# Patient Record
Sex: Female | Born: 1985 | Race: White | Hispanic: No | Smoking: Former smoker
Health system: Southern US, Community
[De-identification: ages and names within clinical notes are randomized; demographics above are authoritative.]

## PROBLEM LIST (undated history)

## (undated) DIAGNOSIS — Z87442 Personal history of urinary calculi: Secondary | ICD-10-CM

## (undated) DIAGNOSIS — Z973 Presence of spectacles and contact lenses: Secondary | ICD-10-CM

## (undated) DIAGNOSIS — F419 Anxiety disorder, unspecified: Secondary | ICD-10-CM

## (undated) DIAGNOSIS — F410 Panic disorder [episodic paroxysmal anxiety] without agoraphobia: Secondary | ICD-10-CM

## (undated) DIAGNOSIS — K603 Anal fistula, unspecified: Secondary | ICD-10-CM

## (undated) DIAGNOSIS — Z8659 Personal history of other mental and behavioral disorders: Secondary | ICD-10-CM

## (undated) DIAGNOSIS — L729 Follicular cyst of the skin and subcutaneous tissue, unspecified: Secondary | ICD-10-CM

## (undated) HISTORY — PX: TONSILLECTOMY: SUR1361

---

## 2017-08-10 ENCOUNTER — Ambulatory Visit: Payer: Self-pay | Admitting: Surgery

## 2017-08-10 NOTE — H&P (View-Only) (Signed)
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 This has been present for about one year. Initially she was able to get it to express itself and it didn't heal, however the last year it has repeatedly become inflamed and started to drain purulent material. She states that if she wears dresses set of pants for prolonged times, it will heal but she has to wear pants for her work uniform and invariably this gets irritated. It does not cause any pain.     Past Surgical History  Tonsillectomy   Diagnostic Studies History  Colonoscopy  never Mammogram  never Pap Smear  1-5 years ago  Allergies  Phenergan *ANTIHISTAMINES*   Medication History  No Current Medications Medications Reconciled  Social History  Alcohol use  Occasional alcohol use. Caffeine use  Tea. No drug use  Tobacco use  Former smoker.  Family History  Alcohol Abuse  Father, Mother. Arthritis  Father. Bleeding disorder  Mother. Depression  Mother. Ischemic Bowel Disease  Mother.  Pregnancy / Birth History  Age at menarche  54 years. Gravida  0 Para  0 Regular periods   Other Problems  Kidney Stone     Review of Systems  General Present- Weight Gain. Not Present- Appetite Loss, Chills, Fatigue, Fever, Night Sweats and Weight Loss. Skin Present- Non-Healing Wounds and Ulcer. Not Present- Change in Wart/Mole, Dryness, Hives, Jaundice, New Lesions and Rash. HEENT Present- Wears glasses/contact lenses. Not Present- Earache, Hearing Loss, Hoarseness, Nose Bleed, Oral Ulcers, Ringing in the Ears, Seasonal Allergies, Sinus Pain, Sore Throat, Visual Disturbances and Yellow Eyes. Respiratory Not Present- Bloody sputum, Chronic Cough, Difficulty Breathing, Snoring and Wheezing. Breast Not Present- Breast  Mass, Breast Pain, Nipple Discharge and Skin Changes. Cardiovascular Present- Leg Cramps. Not Present- Chest Pain, Difficulty Breathing Lying Down, Palpitations, Rapid Heart Rate, Shortness of Breath and Swelling of Extremities. Gastrointestinal Not Present- Abdominal Pain, Bloating, Bloody Stool, Change in Bowel Habits, Chronic diarrhea, Constipation, Difficulty Swallowing, Excessive gas, Gets full quickly at meals, Hemorrhoids, Indigestion, Nausea, Rectal Pain and Vomiting. Female Genitourinary Not Present- Frequency, Nocturia, Painful Urination, Pelvic Pain and Urgency. Musculoskeletal Not Present- Back Pain, Joint Pain, Joint Stiffness, Muscle Pain, Muscle Weakness and Swelling of Extremities. Neurological Present- Numbness. Not Present- Decreased Memory, Fainting, Headaches, Seizures, Tingling, Tremor, Trouble walking and Weakness. Psychiatric Not Present- Anxiety, Bipolar, Change in Sleep Pattern, Depression, Fearful and Frequent crying. Endocrine Not Present- Cold Intolerance, Excessive Hunger, Hair Changes, Heat Intolerance, Hot flashes and New Diabetes. Hematology Not Present- Blood Thinners, Easy Bruising, Excessive bleeding, Gland problems, HIV and Persistent Infections.  Vitals  07/26/2017 9:05 AM Weight: 189.38 lb Height: 60in Body Surface Area: 1.82 m Body Mass Index: 36.98 kg/m  Temp.: 98.2F(Oral)  Pulse: 94 (Regular)  BP: 118/74 (Sitting, Left Arm, Standard)       Physical Exam (Bekka Qian A. Kae Heller MD; 07/26/2017 9:12 AM) The physical exam findings are as follows: Note:Gen: alert and well appearing Eye: extraocular motion intact, no scleral icterus ENT: moist mucus membranes, dentition intact Neck: no mass or thyromegaly Chest: unlabored respirations, symmetrical air entry, clear bilaterally CV: regular rate and rhythm, no pedal edema Abdomen: soft, nontender, nondistended. No mass or organomegaly MSK: strength symmetrical throughout, no deformity Neuro:  grossly intact, normal gait Psych: normal mood and affect, appropriate insight Skin: warm and dry. On the right superior thigh/inferior buttock medially  there is a chronically inflamed subcutaneous cyst which does express white fluid with some pressure. No tenderness or surrounding erythema to suggest infection.    Assessment & Plan SUBCUTANEOUS CYST (L72.9) Story: She desires excision. I discussed risk of bleeding, infection, pain, scarring, wound problems and prolonged healing, recurrence of cyst. Questions were welcomed and answered. We'll proceed to schedule under MAC

## 2017-08-10 NOTE — H&P (Signed)
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 This has been present for about one year. Initially she was able to get it to express itself and it didn't heal, however the last year it has repeatedly become inflamed and started to drain purulent material. She states that if she wears dresses set of pants for prolonged times, it will heal but she has to wear pants for her work uniform and invariably this gets irritated. It does not cause any pain.     Past Surgical History  Tonsillectomy   Diagnostic Studies History  Colonoscopy  never Mammogram  never Pap Smear  1-5 years ago  Allergies  Phenergan *ANTIHISTAMINES*   Medication History  No Current Medications Medications Reconciled  Social History  Alcohol use  Occasional alcohol use. Caffeine use  Tea. No drug use  Tobacco use  Former smoker.  Family History  Alcohol Abuse  Father, Mother. Arthritis  Father. Bleeding disorder  Mother. Depression  Mother. Ischemic Bowel Disease  Mother.  Pregnancy / Birth History  Age at menarche  67 years. Gravida  0 Para  0 Regular periods   Other Problems  Kidney Stone     Review of Systems  General Present- Weight Gain. Not Present- Appetite Loss, Chills, Fatigue, Fever, Night Sweats and Weight Loss. Skin Present- Non-Healing Wounds and Ulcer. Not Present- Change in Wart/Mole, Dryness, Hives, Jaundice, New Lesions and Rash. HEENT Present- Wears glasses/contact lenses. Not Present- Earache, Hearing Loss, Hoarseness, Nose Bleed, Oral Ulcers, Ringing in the Ears, Seasonal Allergies, Sinus Pain, Sore Throat, Visual Disturbances and Yellow Eyes. Respiratory Not Present- Bloody sputum, Chronic Cough, Difficulty Breathing, Snoring and Wheezing. Breast Not Present- Breast  Mass, Breast Pain, Nipple Discharge and Skin Changes. Cardiovascular Present- Leg Cramps. Not Present- Chest Pain, Difficulty Breathing Lying Down, Palpitations, Rapid Heart Rate, Shortness of Breath and Swelling of Extremities. Gastrointestinal Not Present- Abdominal Pain, Bloating, Bloody Stool, Change in Bowel Habits, Chronic diarrhea, Constipation, Difficulty Swallowing, Excessive gas, Gets full quickly at meals, Hemorrhoids, Indigestion, Nausea, Rectal Pain and Vomiting. Female Genitourinary Not Present- Frequency, Nocturia, Painful Urination, Pelvic Pain and Urgency. Musculoskeletal Not Present- Back Pain, Joint Pain, Joint Stiffness, Muscle Pain, Muscle Weakness and Swelling of Extremities. Neurological Present- Numbness. Not Present- Decreased Memory, Fainting, Headaches, Seizures, Tingling, Tremor, Trouble walking and Weakness. Psychiatric Not Present- Anxiety, Bipolar, Change in Sleep Pattern, Depression, Fearful and Frequent crying. Endocrine Not Present- Cold Intolerance, Excessive Hunger, Hair Changes, Heat Intolerance, Hot flashes and New Diabetes. Hematology Not Present- Blood Thinners, Easy Bruising, Excessive bleeding, Gland problems, HIV and Persistent Infections.  Vitals  07/26/2017 9:05 AM Weight: 189.38 lb Height: 60in Body Surface Area: 1.82 m Body Mass Index: 36.98 kg/m  Temp.: 98.33F(Oral)  Pulse: 94 (Regular)  BP: 118/74 (Sitting, Left Arm, Standard)       Physical Exam (Shirley Lewis A. Kae Heller MD; 07/26/2017 9:12 AM) The physical exam findings are as follows: Note:Gen: alert and well appearing Eye: extraocular motion intact, no scleral icterus ENT: moist mucus membranes, dentition intact Neck: no mass or thyromegaly Chest: unlabored respirations, symmetrical air entry, clear bilaterally CV: regular rate and rhythm, no pedal edema Abdomen: soft, nontender, nondistended. No mass or organomegaly MSK: strength symmetrical throughout, no deformity Neuro:  grossly intact, normal gait Psych: normal mood and affect, appropriate insight Skin: warm and dry. On the right superior thigh/inferior buttock medially  there is a chronically inflamed subcutaneous cyst which does express white fluid with some pressure. No tenderness or surrounding erythema to suggest infection.    Assessment & Plan SUBCUTANEOUS CYST (L72.9) Story: She desires excision. I discussed risk of bleeding, infection, pain, scarring, wound problems and prolonged healing, recurrence of cyst. Questions were welcomed and answered. We'll proceed to schedule under MAC

## 2017-08-17 NOTE — Pre-Procedure Instructions (Signed)
Shirley Lewis  08/17/2017      [facility] [place] [address] [place] [address] Phone: [phone] Fax: [phone]    Your procedure is scheduled on Fri., August 24, 2017 from 7:30AM-8:30AM  [facility]" at 5:30AM  Call this number if you have problems the morning of surgery:  [phone]   Remember:  Do not eat or drink after midnight on July 4th    Take these medicines the morning of surgery with A SIP OF WATER: NONE  As of today, stop taking all Other Aspirin Products, Vitamins, Fish oils, and Herbal medications. Also stop all NSAIDS i.e. Advil, Ibuprofen, Motrin, Aleve, Anaprox, Naproxen, BC, Goody Powders, and all Supplements.    Do not wear jewelry, make-up or nail polish.  Do not wear lotions, powders, or perfumes, or deodorant.  Do not shave 48 hours prior to surgery.    Do not bring valuables to the hospital.  [facility] is not responsible for any belongings or valuables.  Contacts, dentures or bridgework may not be worn into surgery.  Leave your suitcase in the car.  After surgery it may be brought to your room.  For patients admitted to the hospital, discharge time will be determined by your treatment team.  Patients discharged the day of surgery will not be allowed to drive home.   Special instructions:  [facility]- Preparing For Surgery  Before surgery, you can play an important role. Because skin is not sterile, your skin needs to be as free of germs as possible. You can reduce the number of germs on your skin by washing with CHG (chlorahexidine gluconate) Soap before surgery.  CHG is an antiseptic cleaner which kills germs and bonds with the skin to continue killing germs even after washing.    Oral Hygiene is also important to reduce your risk of infection.  Remember - BRUSH YOUR TEETH THE MORNING OF SURGERY WITH YOUR REGULAR  TOOTHPASTE  Please do not use if you have an allergy to CHG or antibacterial soaps. If your skin becomes reddened/irritated stop using the CHG.  Do not shave (including legs and underarms) for at least 48 hours prior to first CHG shower. It is OK to shave your face.  Please follow these instructions carefully.   1. Shower the NIGHT BEFORE SURGERY and the MORNING OF SURGERY with CHG.   2. If you chose to wash your hair, wash your hair first as usual with your normal shampoo.  3. After you shampoo, rinse your hair and body thoroughly to remove the shampoo.  4. Use CHG as you would any other liquid soap. You can apply CHG directly to the skin and wash gently with a scrungie or a clean washcloth.   5. Apply the CHG Soap to your body ONLY FROM THE NECK DOWN.  Do not use on open wounds or open sores. Avoid contact with your eyes, ears, mouth and genitals (private parts). Wash Face and genitals (private parts)  with your normal soap.  6. Wash thoroughly, paying special attention to the area where your surgery will be performed.  7. Thoroughly rinse your body with warm water from the neck down.  8. DO NOT shower/wash with your normal soap after using and rinsing off the CHG Soap.  9. Pat yourself dry with a CLEAN TOWEL.  10. Wear CLEAN PAJAMAS to bed the night before surgery, wear comfortable clothes  the morning of surgery  11. Place CLEAN SHEETS on your bed the night of your first shower and DO NOT SLEEP WITH PETS.  Day of Surgery:  Do not apply any deodorants/lotions.  Please wear clean clothes to the hospital/surgery center.   Remember to brush your teeth WITH YOUR REGULAR TOOTHPASTE.  Please read over the following fact sheets that you were given. Pain Booklet, Coughing and Deep Breathing and Surgical Site Infection Prevention

## 2017-08-20 ENCOUNTER — Encounter (HOSPITAL_COMMUNITY)
Admission: RE | Admit: 2017-08-20 | Discharge: 2017-08-20 | Disposition: A | Discharge status: Home / Self Care | Payer: Self-pay | Source: Ambulatory Visit | Attending: Surgery | Admitting: Surgery

## 2017-08-20 ENCOUNTER — Encounter (HOSPITAL_COMMUNITY): Payer: Self-pay

## 2017-08-20 ENCOUNTER — Other Ambulatory Visit: Payer: Self-pay

## 2017-08-20 DIAGNOSIS — Z01812 Encounter for preprocedural laboratory examination: Secondary | ICD-10-CM | POA: Insufficient documentation

## 2017-08-20 HISTORY — DX: Panic disorder (episodic paroxysmal anxiety): F41.0

## 2017-08-20 HISTORY — DX: Follicular cyst of the skin and subcutaneous tissue, unspecified: L72.9

## 2017-08-20 HISTORY — DX: Personal history of urinary calculi: Z87.442

## 2017-08-20 LAB — CBC
HCT: 42.9 % (ref 36.0–46.0)
HEMOGLOBIN: 13.1 g/dL (ref 12.0–15.0)
MCH: 25.6 pg — AB (ref 26.0–34.0)
MCHC: 30.5 g/dL (ref 30.0–36.0)
MCV: 84 fL (ref 78.0–100.0)
Platelets: 335 10*3/uL (ref 150–400)
RBC: 5.11 MIL/uL (ref 3.87–5.11)
RDW: 14.5 % (ref 11.5–15.5)
WBC: 8.2 10*3/uL (ref 4.0–10.5)

## 2017-08-20 LAB — BASIC METABOLIC PANEL
Anion gap: 11 (ref 5–15)
BUN: 12 mg/dL (ref 6–20)
CHLORIDE: 104 mmol/L (ref 98–111)
CO2: 22 mmol/L (ref 22–32)
Calcium: 8.8 mg/dL — ABNORMAL LOW (ref 8.9–10.3)
Creatinine, Ser: 0.89 mg/dL (ref 0.44–1.00)
GFR calc non Af Amer: >60 mL/min (ref >60)
Glucose, Bld: 90 mg/dL (ref 70–99)
Potassium: 3.9 mmol/L (ref 3.5–5.1)
Sodium: 137 mmol/L (ref 135–145)

## 2017-08-20 NOTE — Progress Notes (Signed)
PCP - Denies  Cardiologist - Denies  Chest x-ray - Denies  EKG - Denies  Stress Test - Denies  ECHO - Denies  Cardiac Cath - Denies  Sleep Study - Denies CPAP - None  LABS- 08/20/17: CBC, BMP POC UPreg: 08/24/17  ASA- Denies   Anesthesia- No  Pt denies having chest pain, sob, or fever at this time. All instructions explained to the pt, with a verbal understanding of the material. Pt agrees to go over the instructions while at home for a better understanding. The opportunity to ask questions was provided.

## 2017-08-24 ENCOUNTER — Ambulatory Visit (HOSPITAL_COMMUNITY)
Admission: RE | Admit: 2017-08-24 | Discharge: 2017-08-24 | Disposition: A | Discharge status: Home / Self Care | Payer: Self-pay | Source: Ambulatory Visit | Attending: Surgery | Admitting: Surgery

## 2017-08-24 ENCOUNTER — Ambulatory Visit (HOSPITAL_COMMUNITY): Payer: Self-pay | Admitting: Anesthesiology

## 2017-08-24 ENCOUNTER — Encounter (HOSPITAL_COMMUNITY)
Admission: RE | Disposition: A | Discharge status: Home / Self Care | Payer: Self-pay | Source: Ambulatory Visit | Attending: Surgery

## 2017-08-24 DIAGNOSIS — Z87891 Personal history of nicotine dependence: Secondary | ICD-10-CM | POA: Insufficient documentation

## 2017-08-24 DIAGNOSIS — L02215 Cutaneous abscess of perineum: Secondary | ICD-10-CM | POA: Insufficient documentation

## 2017-08-24 DIAGNOSIS — Z888 Allergy status to other drugs, medicaments and biological substances status: Secondary | ICD-10-CM | POA: Insufficient documentation

## 2017-08-24 HISTORY — PX: CYST REMOVAL TRUNK: SHX6283

## 2017-08-24 LAB — POCT PREGNANCY, URINE: Preg Test, Ur: NEGATIVE

## 2017-08-24 SURGERY — CYST REMOVAL TRUNK
Anesthesia: Monitor Anesthesia Care | Site: Buttocks | Laterality: Right

## 2017-08-24 MED ORDER — FENTANYL CITRATE (PF) 250 MCG/5ML IJ SOLN
INTRAMUSCULAR | Status: DC | PRN
  Administered 2017-08-24 (×2): 50 ug via INTRAVENOUS

## 2017-08-24 MED ORDER — PROPOFOL 10 MG/ML IV BOLUS
INTRAVENOUS | Status: DC | PRN
  Administered 2017-08-24: 20 mg via INTRAVENOUS

## 2017-08-24 MED ORDER — ACETAMINOPHEN 500 MG PO TABS
1000.0000 mg | ORAL_TABLET | ORAL | Status: AC
  Administered 2017-08-24: 1000 mg via ORAL
  Filled 2017-08-24: qty 2

## 2017-08-24 MED ORDER — MIDAZOLAM HCL 5 MG/5ML IJ SOLN
INTRAMUSCULAR | Status: DC | PRN
  Administered 2017-08-24: 2 mg via INTRAVENOUS

## 2017-08-24 MED ORDER — LIDOCAINE 2% (20 MG/ML) 5 ML SYRINGE
INTRAMUSCULAR | Status: AC
  Filled 2017-08-24: qty 10

## 2017-08-24 MED ORDER — PROPOFOL 500 MG/50ML IV EMUL
INTRAVENOUS | Status: DC | PRN
  Administered 2017-08-24: 75 ug/kg/min via INTRAVENOUS

## 2017-08-24 MED ORDER — BUPIVACAINE-EPINEPHRINE 0.25% -1:200000 IJ SOLN
INTRAMUSCULAR | Status: DC | PRN
  Administered 2017-08-24: 10 mL
  Administered 2017-08-24: 4 mL

## 2017-08-24 MED ORDER — ONDANSETRON HCL 4 MG/2ML IJ SOLN
INTRAMUSCULAR | Status: AC
Start: 2017-08-24 — End: ?
  Filled 2017-08-24: qty 4

## 2017-08-24 MED ORDER — LIDOCAINE 2% (20 MG/ML) 5 ML SYRINGE
INTRAMUSCULAR | Status: DC | PRN
  Administered 2017-08-24: 50 mg via INTRAVENOUS

## 2017-08-24 MED ORDER — DEXMEDETOMIDINE HCL 200 MCG/2ML IV SOLN
INTRAVENOUS | Status: DC | PRN
  Administered 2017-08-24 (×4): 8 ug via INTRAVENOUS

## 2017-08-24 MED ORDER — CELECOXIB 200 MG PO CAPS
200.0000 mg | ORAL_CAPSULE | ORAL | Status: AC
  Administered 2017-08-24: 200 mg via ORAL
  Filled 2017-08-24: qty 1

## 2017-08-24 MED ORDER — PROPOFOL 10 MG/ML IV BOLUS
INTRAVENOUS | Status: AC
  Filled 2017-08-24: qty 20

## 2017-08-24 MED ORDER — PROPOFOL 1000 MG/100ML IV EMUL
INTRAVENOUS | Status: AC
  Filled 2017-08-24: qty 100

## 2017-08-24 MED ORDER — LACTATED RINGERS IV SOLN
INTRAVENOUS | Status: DC | PRN
  Administered 2017-08-24: 07:00:00 via INTRAVENOUS

## 2017-08-24 MED ORDER — DOCUSATE SODIUM 100 MG PO CAPS: 100.0000 mg | ORAL_CAPSULE | Freq: Two times a day (BID) | ORAL | 0 refills | Status: AC

## 2017-08-24 MED ORDER — CHLORHEXIDINE GLUCONATE 4 % EX LIQD: 60.0000 mL | Freq: Once | CUTANEOUS | Status: DC

## 2017-08-24 MED ORDER — 0.9 % SODIUM CHLORIDE (POUR BTL) OPTIME
TOPICAL | Status: DC | PRN
  Administered 2017-08-24: 1000 mL

## 2017-08-24 MED ORDER — CEFAZOLIN SODIUM-DEXTROSE 2-4 GM/100ML-% IV SOLN
2.0000 g | INTRAVENOUS | Status: DC
  Filled 2017-08-24: qty 100

## 2017-08-24 MED ORDER — TRAMADOL HCL 50 MG PO TABS: 50.0000 mg | ORAL_TABLET | Freq: Four times a day (QID) | ORAL | 0 refills | Status: AC | PRN

## 2017-08-24 MED ORDER — FENTANYL CITRATE (PF) 250 MCG/5ML IJ SOLN
INTRAMUSCULAR | Status: AC
  Filled 2017-08-24: qty 5

## 2017-08-24 MED ORDER — MIDAZOLAM HCL 2 MG/2ML IJ SOLN
INTRAMUSCULAR | Status: AC
  Filled 2017-08-24: qty 2

## 2017-08-24 MED ORDER — DIPHENHYDRAMINE HCL 50 MG/ML IJ SOLN
INTRAMUSCULAR | Status: DC | PRN
  Administered 2017-08-24: 25 mg via INTRAVENOUS

## 2017-08-24 MED ORDER — DIPHENHYDRAMINE HCL 50 MG/ML IJ SOLN
INTRAMUSCULAR | Status: AC
  Filled 2017-08-24: qty 1

## 2017-08-24 MED ORDER — ONDANSETRON HCL 4 MG/2ML IJ SOLN
INTRAMUSCULAR | Status: DC | PRN
  Administered 2017-08-24: 4 mg via INTRAVENOUS

## 2017-08-24 MED ORDER — ROCURONIUM BROMIDE 10 MG/ML (PF) SYRINGE
PREFILLED_SYRINGE | INTRAVENOUS | Status: AC
  Filled 2017-08-24: qty 10

## 2017-08-24 MED ORDER — BUPIVACAINE-EPINEPHRINE (PF) 0.25% -1:200000 IJ SOLN
INTRAMUSCULAR | Status: AC
  Filled 2017-08-24: qty 30

## 2017-08-24 MED ORDER — GABAPENTIN 300 MG PO CAPS: 300.0000 mg | ORAL_CAPSULE | ORAL | Status: DC

## 2017-08-24 MED ORDER — SUGAMMADEX SODIUM 200 MG/2ML IV SOLN
INTRAVENOUS | Status: AC
Start: 2017-08-24 — End: ?
  Filled 2017-08-24: qty 2

## 2017-08-24 MED FILL — traMADol HCL 50 MG TABS: 50 | 5 days supply | Qty: 20 | Fill #0

## 2017-08-24 SURGICAL SUPPLY — 35 items
CANISTER SUCT 3000ML PPV (MISCELLANEOUS) ×2 IMPLANT
COVER SURGICAL LIGHT HANDLE (MISCELLANEOUS) ×2 IMPLANT
DECANTER SPIKE VIAL GLASS SM (MISCELLANEOUS) ×2 IMPLANT
DERMABOND ADVANCED (GAUZE/BANDAGES/DRESSINGS) ×1
DERMABOND ADVANCED .7 DNX12 (GAUZE/BANDAGES/DRESSINGS) ×1 IMPLANT
DRAPE LAPAROTOMY 100X72 PEDS (DRAPES) ×2 IMPLANT
DRAPE UTILITY XL STRL (DRAPES) ×2 IMPLANT
ELECT CAUTERY BLADE 6.4 (BLADE) ×2 IMPLANT
ELECT REM PT RETURN 9FT ADLT (ELECTROSURGICAL) ×2
ELECTRODE REM PT RTRN 9FT ADLT (ELECTROSURGICAL) ×1 IMPLANT
GAUZE SPONGE 4X4 12PLY STRL (GAUZE/BANDAGES/DRESSINGS) ×2 IMPLANT
GAUZE SPONGE 4X4 12PLY STRL LF (GAUZE/BANDAGES/DRESSINGS) ×2 IMPLANT
GLOVE BIO SURGEON STRL SZ 6 (GLOVE) ×2 IMPLANT
GLOVE INDICATOR 6.5 STRL GRN (GLOVE) ×2 IMPLANT
GOWN STRL REUS W/ TWL LRG LVL3 (GOWN DISPOSABLE) ×1 IMPLANT
GOWN STRL REUS W/TWL LRG LVL3 (GOWN DISPOSABLE) ×1
KIT BASIN OR (CUSTOM PROCEDURE TRAY) ×2 IMPLANT
KIT TURNOVER KIT B (KITS) ×2 IMPLANT
LEGGING LITHOTOMY PAIR STRL (DRAPES) ×2 IMPLANT
NEEDLE HYPO 25GX1X1/2 BEV (NEEDLE) ×2 IMPLANT
NS IRRIG 1000ML POUR BTL (IV SOLUTION) ×2 IMPLANT
PACK SURGICAL SETUP 50X90 (CUSTOM PROCEDURE TRAY) ×2 IMPLANT
PAD ARMBOARD 7.5X6 YLW CONV (MISCELLANEOUS) ×2 IMPLANT
PENCIL BUTTON HOLSTER BLD 10FT (ELECTRODE) ×2 IMPLANT
SPECIMEN JAR MEDIUM (MISCELLANEOUS) ×2 IMPLANT
SPONGE LAP 18X18 X RAY DECT (DISPOSABLE) ×2 IMPLANT
SUT MNCRL AB 4-0 PS2 18 (SUTURE) ×2 IMPLANT
SUT VIC AB 3-0 SH 27 (SUTURE) ×1
SUT VIC AB 3-0 SH 27XBRD (SUTURE) ×1 IMPLANT
SYR BULB 3OZ (MISCELLANEOUS) ×2 IMPLANT
SYR CONTROL 10ML LL (SYRINGE) ×2 IMPLANT
TOWEL OR 17X24 6PK STRL BLUE (TOWEL DISPOSABLE) ×2 IMPLANT
TOWEL OR 17X26 10 PK STRL BLUE (TOWEL DISPOSABLE) ×2 IMPLANT
TUBE CONNECTING 12X1/4 (SUCTIONS) ×2 IMPLANT
YANKAUER SUCT BULB TIP NO VENT (SUCTIONS) IMPLANT

## 2017-08-24 NOTE — Op Note (Signed)
Operative Note  Shirley Lewis  [id number]  [id number]  08/24/2017   Surgeon: Vikki Ports A ConnorMD  Assistant: none  Procedure performed: excision of subcutaneous 3x2x2cm cyst/ chronic draining abscess right perineum  Preop diagnosis: chronic draining abscess/ subcutaneous cyst Post-op diagnosis/intraop findings: same  Specimens: right perineal subcutaneous cyst Retained items: no EBL: minimal cc Complications: none  Description of procedure: After obtaining informed consent the patient was taken to the operating room and placed in the lithotomy position on operating room table [facility] was initiated, preoperative antibiotics were administered, SCDs applied, and a formal timeout was performed. The perineum was prepped and draped in usual sterile fashion. After infiltration with local, an elliptical incision was made around the chronic subcutaneous cyst/abscess which was located several centimeters to the right of the perineal body. Dissection was carried down using cautery and the chronic granulation tissue/cyst wall was noted to track anteromedially and was somewhat deep, about 2 cm. The cyst was excised and passed off for pathology. The remaining chronic granulation tissue in the deeper tract was ablated with cautery. Hemostasis was ensured within the wound. The wound was then closed with interrupted deep dermal 30 Vicryls and running subcuticular Monocryl. A dry dressing was applied. The patient was then awakened, returned to the supine position and taken to PACU in stable condition.   All counts were correct at the completion of the case.

## 2017-08-24 NOTE — Anesthesia Preprocedure Evaluation (Signed)
Anesthesia Evaluation  Patient identified by MRN, date of birth, ID band Patient awake    Reviewed: Allergy & Precautions, NPO status , Patient's Chart, lab work & pertinent test results  Airway Mallampati: II  TM Distance: >3 FB Neck ROM: Full    Dental no notable dental hx.    Pulmonary neg pulmonary ROS,    Pulmonary exam normal breath sounds clear to auscultation       Cardiovascular negative cardio ROS Normal cardiovascular exam Rhythm:Regular Rate:Normal     Neuro/Psych negative neurological ROS  negative psych ROS   GI/Hepatic negative GI ROS, Neg liver ROS,   Endo/Other  negative endocrine ROS  Renal/GU negative Renal ROS  negative genitourinary   Musculoskeletal negative musculoskeletal ROS (+)   Abdominal   Peds negative pediatric ROS (+)  Hematology negative hematology ROS (+)   Anesthesia Other Findings   Reproductive/Obstetrics negative OB ROS                             Anesthesia Physical Anesthesia Plan  ASA: II  Anesthesia Plan: MAC   Post-op Pain Management:    Induction: Intravenous  PONV Risk Score and Plan: 2 and Ondansetron and Treatment may vary due to age or medical condition  Airway Management Planned: Simple Face Mask  Additional Equipment:   Intra-op Plan:   Post-operative Plan:   Informed Consent: I have reviewed the patients History and Physical, chart, labs and discussed the procedure including the risks, benefits and alternatives for the proposed anesthesia with the patient or authorized representative who has indicated his/her understanding and acceptance.   Dental advisory given  Plan Discussed with: CRNA  Anesthesia Plan Comments:         Anesthesia Quick Evaluation

## 2017-08-24 NOTE — Transfer of Care (Signed)
Immediate Anesthesia Transfer of Care Note  Patient: Systems analyst  Procedure(s) Performed: EXCISION SUBCUTANEOUS CYST RIGHT BUTTOCK/THIGH (Right Buttocks)  Patient Location: PACU  Anesthesia Type:MAC  Level of Consciousness: awake, alert  and oriented  Airway & Oxygen Therapy: Patient Spontanous Breathing and Patient connected to nasal cannula oxygen  Post-op Assessment: Report given to RN, Post -op Vital signs reviewed and stable and Patient moving all extremities X 4  Post vital signs: Reviewed and stable  Last Vitals:  Vitals Value Taken Time  BP 109/68 08/24/2017  8:18 AM  Temp    Pulse 68 08/24/2017  8:19 AM  Resp 20 08/24/2017  8:19 AM  SpO2 99 % 08/24/2017  8:19 AM  Vitals shown include unvalidated device data.  Last Pain:  Vitals:   08/24/17 0641  PainSc: 0-No pain         Complications: No apparent anesthesia complications

## 2017-08-24 NOTE — Anesthesia Procedure Notes (Signed)
Procedure Name: MAC Date/Time: 08/24/2017 7:31 AM Performed by: Mariea Clonts, CRNA Pre-anesthesia Checklist: Patient identified, Emergency Drugs available, Suction available, Patient being monitored and Timeout performed Patient Re-evaluated:Patient Re-evaluated prior to induction Oxygen Delivery Method: Nasal cannula

## 2017-08-24 NOTE — Discharge Instructions (Signed)
GENERAL SURGERY: POST OP INSTRUCTIONS  ######################################################################  EAT Gradually transition to a high fiber diet with a fiber supplement over the next few weeks after discharge.  Start with a pureed / full liquid diet (see below)  WALK Walk an hour a day.  Control your pain to do that.    CONTROL PAIN Control pain so that you can walk, sleep, tolerate sneezing/coughing, go up/down stairs.  HAVE A BOWEL MOVEMENT DAILY Keep your bowels regular to avoid problems.  OK to try a laxative to override constipation.  OK to use an antidairrheal to slow down diarrhea.  Call if not better after 2 tries  CALL IF YOU HAVE PROBLEMS/CONCERNS Call if you are still struggling despite following these instructions. Call if you have concerns not answered by these instructions  ######################################################################    1. DIET: Follow a light bland diet the first 24 hours after arrival home, such as soup, liquids, crackers, etc.  Be sure to include lots of fluids daily.  Avoid fast food or heavy meals as you are more likely to get nauseated.   2. Take your usually prescribed home medications unless otherwise directed. 3. PAIN CONTROL: a. Pain is best controlled by a usual combination of three different methods TOGETHER: i. Ice/Heat ii. Over the counter pain medication iii. Prescription pain medication b. Most patients will experience some swelling and bruising around the incisions.  Ice packs or heating pads (30-60 minutes up to 6 times a day) will help. Use ice for the first few days to help decrease swelling and bruising, then switch to heat to help relax tight/sore spots and speed recovery.  Some people prefer to use ice alone, heat alone, alternating between ice & heat.  Experiment to what works for you.  Swelling and bruising can take several weeks to resolve.   c. It is helpful to take an over-the-counter pain medication  regularly for the first few weeks.  Choose one of the following that works best for you: i. Naproxen (Aleve, etc)  Two 220mg  tabs twice a day ii. Ibuprofen (Advil, etc) Three 200mg  tabs four times a day (every meal & bedtime) iii. Acetaminophen (Tylenol, etc) 500-650mg  four times a day (every meal & bedtime) d. A  prescription for pain medication (such as oxycodone, hydrocodone, etc) should be given to you upon discharge.  Take your pain medication as prescribed.  i. If you are having problems/concerns with the prescription medicine (does not control pain, nausea, vomiting, rash, itching, etc), please call us [phone] to see if we need to switch you to a different pain medicine that will work better for you and/or control your side effect better. ii. If you need a refill on your pain medication, please contact your pharmacy.  They will contact our office to request authorization. Prescriptions will not be filled after 5 pm or on week-ends. 4. Avoid getting constipated.  Between the surgery and the pain medications, it is common to experience some constipation.  Increasing fluid intake and taking a fiber supplement (such as Metamucil, Citrucel, FiberCon, MiraLax, etc) 1-2 times a day regularly will usually help prevent this problem from occurring.  A mild laxative (prune juice, Milk of Magnesia, MiraLax, etc) should be taken according to package directions if there are no bowel movements after 48 hours.   5. Wash / shower every day starting on post op day 2.  Remove any dressings on post op day 2 or if they become saturated or dirty.  Continue to shower over  incision(s) after the dressing is off. No soaking or swimming until the incision is healed.  6. Remove your bandages 2 days after surgery, or earlier if they become saturated or dirty.  You may leave the incision open to air.  You may have skin tapes (Steri Strips) covering the incision(s).  Leave them on until one week, then remove.  You may  replace a dressing/Band-Aid to cover the incision for comfort if you wish.      7. ACTIVITIES as tolerated:   a. You may resume regular (light) daily activities beginning the next day--such as daily self-care, walking, climbing stairs--gradually increasing activities as tolerated.  If you can walk 30 minutes without difficulty, it is safe to try more intense activity such as jogging, treadmill, bicycling, low-impact aerobics, swimming, etc. b. Save the most intensive and strenuous activity for last such as sit-ups, heavy lifting, contact sports, etc  Refrain from any heavy lifting or straining until you are off narcotics for pain control.   c. DO NOT PUSH THROUGH PAIN.  Let pain be your guide: If it hurts to do something, don't do it.  Pain is your body warning you to avoid that activity for another week until the pain goes down. d. You may drive when you are no longer taking prescription pain medication, you can comfortably wear a seatbelt, and you can safely maneuver your car and apply brakes. e. Bonita QuinYou may have sexual intercourse when it is comfortable.  8. FOLLOW UP in our office a. Please call [facility] at [phone] to set up an appointment to see your surgeon in the office for a follow-up appointment approximately 2-3 weeks after your surgery. b. Make sure that you call for this appointment the day you arrive home to insure a convenient appointment time. 9. IF YOU HAVE DISABILITY OR FAMILY LEAVE FORMS, BRING THEM TO THE OFFICE FOR PROCESSING.  DO NOT GIVE THEM TO YOUR DOCTOR.   WHEN TO CALL US [phone]: 1. Poor pain control 2. Reactions / problems with new medications (rash/itching, nausea, etc)  3. Fever over 101.5 F (38.5 C) 4. Worsening swelling or bruising 5. Continued bleeding from incision. 6. Increased pain, redness, or drainage from the incision 7. Difficulty breathing / swallowing   The clinic staff is available to answer your questions during regular business hours  (8:30am-5pm).  Please dont hesitate to call and ask to speak to one of our nurses for clinical concerns.   If you have a medical emergency, go to the nearest emergency room or call 911.  [facility]   [facility], [place] [address], [address], [place], [place]  [address] ? MAIN: [phone] ? TOLL FREE: [phone] ?  FAX [phone] [email or web address]

## 2017-08-24 NOTE — Interval H&P Note (Signed)
History and Physical Interval Note:  08/24/2017 7:06 AM  Shirley Lewis  has presented today for surgery, with the diagnosis of CHRONICALLY INFECTED SUBCUTANEOUS CYST  The various methods of treatment have been discussed with the patient and family. After consideration of risks, benefits and other options for treatment, the patient has consented to  Procedure(s): EXCISION SUBCUTANEOUS CYST RIGHT BUTTOCK/THIGH (Right) as a surgical intervention .  The patient's history has been reviewed, patient examined, no change in status, stable for surgery.  I have reviewed the patient's chart and labs.  Questions were answered to the patient's satisfaction.     Bo Rogue Lollie SailsA Deontaye Civello

## 2017-08-27 ENCOUNTER — Encounter (HOSPITAL_COMMUNITY): Payer: Self-pay | Admitting: Surgery

## 2017-08-27 NOTE — Anesthesia Postprocedure Evaluation (Signed)
Anesthesia Post Note  Patient: Systems analyst  Procedure(s) Performed: EXCISION SUBCUTANEOUS CYST RIGHT BUTTOCK/THIGH (Right Buttocks)     Patient location during evaluation: PACU Anesthesia Type: MAC Level of consciousness: awake and alert Pain management: pain level controlled Vital Signs Assessment: post-procedure vital signs reviewed and stable Respiratory status: spontaneous breathing, nonlabored ventilation, respiratory function stable and patient connected to nasal cannula oxygen Cardiovascular status: stable and blood pressure returned to baseline Postop Assessment: no apparent nausea or vomiting Anesthetic complications: no    Last Vitals:  Vitals:   08/24/17 0828 08/24/17 0830  BP: 107/70   Pulse:    Resp:    Temp:  36.6 C  SpO2:      Last Pain:  Vitals:   08/24/17 0828  PainSc: 0-No pain                 Montez Hageman

## 2019-04-22 ENCOUNTER — Ambulatory Visit: Payer: BC Managed Care – PPO | Admitting: Family Medicine

## 2019-04-22 ENCOUNTER — Other Ambulatory Visit: Payer: Self-pay

## 2019-04-22 ENCOUNTER — Encounter: Payer: Self-pay | Admitting: Family Medicine

## 2019-04-22 DIAGNOSIS — L729 Follicular cyst of the skin and subcutaneous tissue, unspecified: Secondary | ICD-10-CM | POA: Diagnosis not present

## 2019-04-22 MED ORDER — CEPHALEXIN 500 MG PO CAPS: 500.0000 mg | ORAL_CAPSULE | Freq: Two times a day (BID) | ORAL | 0 refills | Status: DC

## 2019-04-22 NOTE — Progress Notes (Signed)
Acute Office Visit  Subjective:    Patient ID: Shirley Lewis, female    DOB: 1985/12/28, 34 y.o.   MRN: [id number]  Chief Complaint  Patient presents with  . Follow-up    Patient had cyst removed 1 year ago. She feels that the area may be infected. Reports clear/pus drainage, no odor, not painful, some redness.    HPI Patient is in today for cyst. Patient had cyst removed 1 year ago. She feels that the area may be infected. Reports clear/pus drainage, no odor, not painful, some redness. Denies fever. Or chills.  Past Medical History:  Diagnosis Date  . Cyst of buttocks    Right spreading to right inner thigh  . History of kidney stones   . Panic attack     Past Surgical History:  Procedure Laterality Date  . CYST REMOVAL TRUNK Right 08/24/2017   Procedure: EXCISION SUBCUTANEOUS CYST RIGHT BUTTOCK/THIGH;  Surgeon: Clovis Riley, MD;  Location: [facility];  Service: General;  Laterality: Right;  . TONSILLECTOMY      Family History  Problem Relation Age of Onset  . Hypertension Maternal Grandmother   . Asthma Other   . COPD Other   . Hypertension Other   . Osteoarthritis Other   . Heart attack Other   . Cancer Other        Lung  . Mental illness Other     Social History   Socioeconomic History  . Marital status: Single    Spouse name: Not on file  . Number of children: 0  . Years of education: Not on file  . Highest education level: Not on file  Occupational History  . Occupation: Investment banker, corporate    Comment: [employer]  Tobacco Use  . Smoking status: Former Smoker    Quit date: 2012    Years since quitting: 9.1  . Smokeless tobacco: Never Used  Substance and Sexual Activity  . Alcohol use: Yes    Comment: occasional  . Drug use: Never  . Sexual activity: Not on file  Other Topics Concern  . Not on file  Social History Narrative  . Not on file   Social Determinants of Health   Financial Resource Strain:   . Difficulty of Paying Living  Expenses: Not on file  Food Insecurity:   . Worried About Charity fundraiser in the Last Year: Not on file  . Ran Out of Food in the Last Year: Not on file  Transportation Needs:   . Lack of Transportation (Medical): Not on file  . Lack of Transportation (Non-Medical): Not on file  Physical Activity:   . Days of Exercise per Week: Not on file  . Minutes of Exercise per Session: Not on file  Stress:   . Feeling of Stress : Not on file  Social Connections:   . Frequency of Communication with Friends and Family: Not on file  . Frequency of Social Gatherings with Friends and Family: Not on file  . Attends Religious Services: Not on file  . Active Member of Clubs or Organizations: Not on file  . Attends Archivist Meetings: Not on file  . Marital Status: Not on file  Intimate Partner Violence:   . Fear of Current or Ex-Partner: Not on file  . Emotionally Abused: Not on file  . Physically Abused: Not on file  . Sexually Abused: Not on file    No outpatient medications prior to visit.   No facility-administered medications prior  to visit.    Allergies  Allergen Reactions  . Phenergan [Promethazine Hcl] Nausea And Vomiting    Review of Systems  Constitutional: Negative for chills, fatigue and fever.  HENT: Negative for congestion, ear pain and sore throat.   Respiratory: Negative for cough and shortness of breath.        Objective:    Physical Exam Vitals reviewed.  Constitutional:      Appearance: Normal appearance.  Skin:    Comments: Perineal cyst - draining. Mildly tender.  Neurological:     Mental Status: She is alert.     BP 124/80 (BP Location: Left Arm, Patient Position: Sitting)   Pulse 87   Temp 97.6 F (36.4 C) (Temporal)   Ht 5' (1.524 m)   Wt 223 lb (101.2 kg)   SpO2 100%   BMI 43.55 kg/m  Wt Readings from Last 3 Encounters:  04/22/19 223 lb (101.2 kg)  08/20/17 191 lb 9.6 oz (86.9 kg)    Health Maintenance Due  Topic Date Due  .  HIV Screening  12/05/1999  . TETANUS/TDAP  12/05/2003  . PAP SMEAR-Modifier  12/04/2005  . INFLUENZA VACCINE  09/21/2018    There are no preventive care reminders to display for this patient.   No results found for: TSH Lab Results  Component Value Date   WBC 8.2 08/20/2017   HGB 13.1 08/20/2017   HCT 42.9 08/20/2017   MCV 84.0 08/20/2017   PLT 335 08/20/2017   Lab Results  Component Value Date   NA 137 08/20/2017   K 3.9 08/20/2017   CO2 22 08/20/2017   GLUCOSE 90 08/20/2017   BUN 12 08/20/2017   CREATININE 0.89 08/20/2017   CALCIUM 8.8 (L) 08/20/2017   ANIONGAP 11 08/20/2017   No results found for: CHOL No results found for: HDL No results found for: LDLCALC No results found for: TRIG No results found for: CHOLHDL No results found for: OXBD5H     Assessment & Plan:  Cyst of buttocks Started on keflex 500 mg one twice a day.  If does not resolve with antibiotic patient needs to call her surgeon or call us for referral.  I believe it may resolve just with antibiotics.   Blane Ohara, MD

## 2019-05-04 ENCOUNTER — Encounter: Payer: Self-pay | Admitting: Family Medicine

## 2019-05-04 DIAGNOSIS — L729 Follicular cyst of the skin and subcutaneous tissue, unspecified: Secondary | ICD-10-CM | POA: Insufficient documentation

## 2019-05-04 NOTE — Assessment & Plan Note (Signed)
Started on keflex 500 mg one twice a day.  If does not resolve with antibiotic patient needs to call her surgeon or call us for referral.  I believe it may resolve just with antibiotics.

## 2019-05-15 ENCOUNTER — Encounter: Payer: BC Managed Care – PPO | Admitting: Family Medicine

## 2019-06-12 ENCOUNTER — Other Ambulatory Visit: Payer: Self-pay

## 2019-06-12 ENCOUNTER — Encounter: Payer: Self-pay | Admitting: Family Medicine

## 2019-06-12 ENCOUNTER — Ambulatory Visit (INDEPENDENT_AMBULATORY_CARE_PROVIDER_SITE_OTHER): Payer: BC Managed Care – PPO | Admitting: Family Medicine

## 2019-06-12 VITALS — BP 120/85 | HR 85 | Temp 97.4°F | Ht 60.0 in | Wt 215.0 lb

## 2019-06-12 DIAGNOSIS — Z0001 Encounter for general adult medical examination with abnormal findings: Secondary | ICD-10-CM | POA: Diagnosis not present

## 2019-06-12 DIAGNOSIS — Z6841 Body Mass Index (BMI) 40.0 and over, adult: Secondary | ICD-10-CM

## 2019-06-12 MED ORDER — PHENTERMINE HCL 37.5 MG PO CAPS: 37.5000 mg | ORAL_CAPSULE | ORAL | 0 refills | Status: DC

## 2019-06-12 NOTE — Patient Instructions (Addendum)
Phentermine 37.5 mg once daily in am.   Preventive Care 40-35 Years Old, Female Preventive care refers to visits with your health care provider and lifestyle choices that can promote health and wellness. This includes:  A yearly physical exam. This may also be called an annual well check.  Regular dental visits and eye exams.  Immunizations.  Screening for certain conditions.  Healthy lifestyle choices, such as eating a healthy diet, getting regular exercise, not using drugs or products that contain nicotine and tobacco, and limiting alcohol use. What can I expect for my preventive care visit? Physical exam Your health care provider will check your:  Height and weight. This may be used to calculate body mass index (BMI), which tells if you are at a healthy weight.  Heart rate and blood pressure.  Skin for abnormal spots. Counseling Your health care provider may ask you questions about your:  Alcohol, tobacco, and drug use.  Emotional well-being.  Home and relationship well-being.  Sexual activity.  Eating habits.  Work and work Statistician.  Method of birth control.  Menstrual cycle.  Pregnancy history. What immunizations do I need?  Influenza (flu) vaccine  This is recommended every year. Tetanus, diphtheria, and pertussis (Tdap) vaccine  You may need a Td booster every 35 years. Varicella (chickenpox) vaccine  You may need this if you have not been vaccinated. Human papillomavirus (HPV) vaccine  If recommended by your health care provider, you may need three doses over 6 months. Measles, mumps, and rubella (MMR) vaccine  You may need at least one dose of MMR. You may also need a second dose. Meningococcal conjugate (MenACWY) vaccine  One dose is recommended if you are age 35-21 years and a first-year college student living in a residence hall, or if you have one of several medical conditions. You may also need additional booster doses. Pneumococcal  conjugate (PCV13) vaccine  You may need this if you have certain conditions and were not previously vaccinated. Pneumococcal polysaccharide (PPSV23) vaccine  You may need one or two doses if you smoke cigarettes or if you have certain conditions. Hepatitis A vaccine  You may need this if you have certain conditions or if you travel or work in places where you may be exposed to hepatitis A. Hepatitis B vaccine  You may need this if you have certain conditions or if you travel or work in places where you may be exposed to hepatitis B. Haemophilus influenzae type b (Hib) vaccine  You may need this if you have certain conditions. You may receive vaccines as individual doses or as more than one vaccine together in one shot (combination vaccines). Talk with your health care provider about the risks and benefits of combination vaccines. What tests do I need?  Blood tests  Lipid and cholesterol levels. These may be checked every 5 years starting at age 35.  Hepatitis C test.  Hepatitis B test. Screening  Diabetes screening. This is done by checking your blood sugar (glucose) after you have not eaten for a while (fasting).  Sexually transmitted disease (STD) testing.  BRCA-related cancer screening. This may be done if you have a family history of breast, ovarian, tubal, or peritoneal cancers.  Pelvic exam and Pap test. This may be done every 3 years starting at age 35. Starting at age 35, this may be done every 5 years if you have a Pap test in combination with an HPV test. Talk with your health care provider about your test results, treatment  options, and if necessary, the need for more tests. Follow these instructions at home: Eating and drinking   Eat a diet that includes fresh fruits and vegetables, whole grains, lean protein, and low-fat dairy.  Take vitamin and mineral supplements as recommended by your health care provider.  Do not drink alcohol if: ? Your health care  provider tells you not to drink. ? You are pregnant, may be pregnant, or are planning to become pregnant.  If you drink alcohol: ? Limit how much you have to 0-1 drink a day. ? Be aware of how much alcohol is in your drink. In the [place] one drink equals one 12 oz bottle of beer (355 mL), one 5 oz glass of wine (148 mL), or one 1 oz glass of hard liquor (44 mL). Lifestyle  Take daily care of your teeth and gums.  Stay active. Exercise for at least 30 minutes on 5 or more days each week.  Do not use any products that contain nicotine or tobacco, such as cigarettes, e-cigarettes, and chewing tobacco. If you need help quitting, ask your health care provider.  If you are sexually active, practice safe sex. Use a condom or other form of birth control (contraception) in order to prevent pregnancy and STIs (sexually transmitted infections). If you plan to become pregnant, see your health care provider for a preconception visit. What's next?  Visit your health care provider once a year for a well check visit.  Ask your health care provider how often you should have your eyes and teeth checked.  Stay up to date on all vaccines. This information is not intended to replace advice given to you by your health care provider. Make sure you discuss any questions you have with your health care provider. Document Revised: 10/18/2017 Document Reviewed: 10/18/2017 Elsevier Patient Education  [place] Many factors influence your heart (coronary) health, including eating and exercise habits. Coronary risk increases with abnormal blood fat (lipid) levels. Heart-healthy meal planning includes limiting unhealthy fats, increasing healthy fats, and making other diet and lifestyle changes. What are tips for following this plan? Cooking Cook foods using methods other than frying. Baking, boiling, grilling, and broiling are all good options. Other ways to reduce fat  include:  Removing the skin from poultry.  Removing all visible fats from meats.  Steaming vegetables in water or broth. Meal planning   At meals, imagine dividing your plate into fourths: ? Fill one-half of your plate with vegetables and green salads. ? Fill one-fourth of your plate with whole grains. ? Fill one-fourth of your plate with lean protein foods.  Eat 4-5 servings of vegetables per day. One serving equals 1 cup raw or cooked vegetable, or 2 cups raw leafy greens.  Eat 4-5 servings of fruit per day. One serving equals 1 medium whole fruit,  cup dried fruit,  cup fresh, frozen, or canned fruit, or  cup 100% fruit juice.  Eat more foods that contain soluble fiber. Examples include apples, broccoli, carrots, beans, peas, and barley. Aim to get 25-30 g of fiber per day.  Increase your consumption of legumes, nuts, and seeds to 4-5 servings per week. One serving of dried beans or legumes equals  cup cooked, 1 serving of nuts is  cup, and 1 serving of seeds equals 1 tablespoon. Fats  Choose healthy fats more often. Choose monounsaturated and polyunsaturated fats, such as olive and canola oils, flaxseeds, walnuts, almonds, and seeds.  Eat more omega-3 fats.  Choose salmon, mackerel, sardines, tuna, flaxseed oil, and ground flaxseeds. Aim to eat fish at least 2 times each week.  Check food labels carefully to identify foods with trans fats or high amounts of saturated fat.  Limit saturated fats. These are found in animal products, such as meats, butter, and cream. Plant sources of saturated fats include palm oil, palm kernel oil, and coconut oil.  Avoid foods with partially hydrogenated oils in them. These contain trans fats. Examples are stick margarine, some tub margarines, cookies, crackers, and other baked goods.  Avoid fried foods. General information  Eat more home-cooked food and less restaurant, buffet, and fast food.  Limit or avoid alcohol.  Limit foods that  are high in starch and sugar.  Lose weight if you are overweight. Losing just 5-10% of your body weight can help your overall health and prevent diseases such as diabetes and heart disease.  Monitor your salt (sodium) intake, especially if you have high blood pressure. Talk with your health care provider about your sodium intake.  Try to incorporate more vegetarian meals weekly. What foods can I eat? Fruits All fresh, canned (in natural juice), or frozen fruits. Vegetables Fresh or frozen vegetables (raw, steamed, roasted, or grilled). Green salads. Grains Most grains. Choose whole wheat and whole grains most of the time. Rice and pasta, including brown rice and pastas made with whole wheat. Meats and other proteins Lean, well-trimmed beef, veal, pork, and lamb. Chicken and [place] without skin. All fish and shellfish. Wild duck, rabbit, pheasant, and venison. Egg whites or low-cholesterol egg substitutes. Dried beans, peas, lentils, and tofu. Seeds and most nuts. Dairy Low-fat or nonfat cheeses, including ricotta and mozzarella. Skim or 1% milk (liquid, powdered, or evaporated). Buttermilk made with low-fat milk. Nonfat or low-fat yogurt. Fats and oils Non-hydrogenated (trans-free) margarines. Vegetable oils, including soybean, sesame, sunflower, olive, peanut, safflower, corn, canola, and cottonseed. Salad dressings or mayonnaise made with a vegetable oil. Beverages Water (mineral or sparkling). Coffee and tea. Diet carbonated beverages. Sweets and desserts Sherbet, gelatin, and fruit ice. Small amounts of dark chocolate. Limit all sweets and desserts. Seasonings and condiments All seasonings and condiments. The items listed above may not be a complete list of foods and beverages you can eat. Contact a dietitian for more options. What foods are not recommended? Fruits Canned fruit in heavy syrup. Fruit in cream or butter sauce. Fried fruit. Limit coconut. Vegetables Vegetables cooked  in cheese, cream, or butter sauce. Fried vegetables. Grains Breads made with saturated or trans fats, oils, or whole milk. Croissants. Sweet rolls. Donuts. High-fat crackers, such as cheese crackers. Meats and other proteins Fatty meats, such as hot dogs, ribs, sausage, bacon, rib-eye roast or steak. High-fat deli meats, such as salami and bologna. Caviar. Domestic duck and goose. Organ meats, such as liver. Dairy Cream, sour cream, cream cheese, and creamed cottage cheese. Whole milk cheeses. Whole or 2% milk (liquid, evaporated, or condensed). Whole buttermilk. Cream sauce or high-fat cheese sauce. Whole-milk yogurt. Fats and oils Meat fat, or shortening. Cocoa butter, hydrogenated oils, palm oil, coconut oil, palm kernel oil. Solid fats and shortenings, including bacon fat, salt pork, lard, and butter. Nondairy cream substitutes. Salad dressings with cheese or sour cream. Beverages Regular sodas and any drinks with added sugar. Sweets and desserts Frosting. Pudding. Cookies. Cakes. Pies. Milk chocolate or white chocolate. Buttered syrups. Full-fat ice cream or ice cream drinks. The items listed above may not be a complete list of foods and beverages to  avoid. Contact a dietitian for more information. Summary  Heart-healthy meal planning includes limiting unhealthy fats, increasing healthy fats, and making other diet and lifestyle changes.  Lose weight if you are overweight. Losing just 5-10% of your body weight can help your overall health and prevent diseases such as diabetes and heart disease.  Focus on eating a balance of foods, including fruits and vegetables, low-fat or nonfat dairy, lean protein, nuts and legumes, whole grains, and heart-healthy oils and fats. This information is not intended to replace advice given to you by your health care provider. Make sure you discuss any questions you have with your health care provider. Document Revised: 03/16/2017 Document Reviewed:  03/16/2017 Elsevier Patient Education  2020 [employer].

## 2019-06-12 NOTE — Progress Notes (Signed)
Subjective:  Patient ID: Rowe Robert, female    DOB: 22-Mar-1985  Age: 34 y.o. MRN: [id number]  Chief Complaint  Patient presents with  . Annual Exam    Last PAP was 3 yrs ago was normal. Has had 1 abnormal PAP in the past.    HPI  Here for annual physical and it has been years, but had a normal pap smear 3 years ago. Negative for hpv. Lives with boyfriend. Feels safe. Sexually active.  Seatbelt: yes Guns: no.  Functional smoke/carbon monoxide detectors. Not using birth control. Ramona if gets pregnant.   Does not see a dentist. Sees Dr. Renaldo Fiddler (eye doctor.) Patient is morbid obesity.  Diet: skips breakfast. Eats lunch (celery and PB, PB and J. Ramen), Supper: meatloaf, lasagna, spaghetti.  Eats some fruits and vegetables.  Not exercising.   Social Hx   Social History   Socioeconomic History  . Marital status: Single    Spouse name: Not on file  . Number of children: 0  . Years of education: Not on file  . Highest education level: Not on file  Occupational History  . Occupation: Investment banker, corporate    Comment: [employer]  Tobacco Use  . Smoking status: Former Smoker    Quit date: 2012    Years since quitting: 9.3  . Smokeless tobacco: Never Used  Substance and Sexual Activity  . Alcohol use: Yes    Comment: occasional  . Drug use: Never  . Sexual activity: Not on file  Other Topics Concern  . Not on file  Social History Narrative  . Not on file   Social Determinants of Health   Financial Resource Strain:   . Difficulty of Paying Living Expenses:   Food Insecurity:   . Worried About Charity fundraiser in the Last Year:   . Arboriculturist in the Last Year:   Transportation Needs:   . Film/video editor (Medical):   Marland Kitchen Lack of Transportation (Non-Medical):   Physical Activity:   . Days of Exercise per Week:   . Minutes of Exercise per Session:   Stress:   . Feeling of Stress :   Social Connections:   . Frequency of Communication with Friends  and Family:   . Frequency of Social Gatherings with Friends and Family:   . Attends Religious Services:   . Active Member of Clubs or Organizations:   . Attends Archivist Meetings:   Marland Kitchen Marital Status:    Past Medical History:  Diagnosis Date  . Cyst of buttocks    Right spreading to right inner thigh  . History of kidney stones   . Panic attack    Family History  Problem Relation Age of Onset  . Hypertension Maternal Grandmother   . Asthma Other   . COPD Other   . Hypertension Other   . Osteoarthritis Other   . Heart attack Other   . Cancer Other        Lung  . Mental illness Other     Review of Systems  Constitutional: Negative for chills, diaphoresis, fatigue and fever.  HENT: Negative for congestion, ear pain, rhinorrhea and sore throat.   Respiratory: Negative for cough and shortness of breath.   Cardiovascular: Negative for chest pain.  Gastrointestinal: Negative for abdominal pain, constipation, diarrhea, nausea and vomiting.  Genitourinary: Negative for dysuria, frequency, hematuria, menstrual problem (Has a hx of abnormal menstrual cycles. ONce a month. irregular - lasts 1-5 day. Flow varies.  LMP last month.  Improved when was on OCPs., but discontinued.), pelvic pain, urgency, vaginal bleeding, vaginal discharge and vaginal pain.  Musculoskeletal: Negative for arthralgias, back pain and myalgias.  Neurological: Negative for dizziness, weakness, light-headedness and headaches.  Psychiatric/Behavioral: Negative for dysphoric mood. The patient is not nervous/anxious.      Objective:  Pulse 85   Temp (!) 97.4 F (36.3 C) (Temporal)   Ht 5' (1.524 m)   Wt 215 lb (97.5 kg)   SpO2 99%   BMI 41.99 kg/m   BP/Weight 06/12/2019 04/22/2019 08/24/2017  Systolic BP - 124 107  Diastolic BP - 80 70  Wt. (Lbs) 215 223 -  BMI 41.99 43.55 -    Physical Exam Vitals reviewed.  Constitutional:      Appearance: Normal appearance. She is obese.  HENT:     Right  Ear: Tympanic membrane, ear canal and external ear normal.     Left Ear: Tympanic membrane, ear canal and external ear normal.     Nose: Nose normal.     Mouth/Throat:     Mouth: Mucous membranes are moist.     Pharynx: Oropharynx is clear.  Cardiovascular:     Rate and Rhythm: Normal rate and regular rhythm.     Heart sounds: Normal heart sounds.  Pulmonary:     Effort: Pulmonary effort is normal. No respiratory distress.     Breath sounds: Normal breath sounds.  Abdominal:     General: Bowel sounds are normal.     Palpations: Abdomen is soft.     Tenderness: There is no abdominal tenderness.  Musculoskeletal:        General: Normal range of motion.     Cervical back: Normal range of motion.  Skin:    General: Skin is warm.  Neurological:     Mental Status: She is alert.  Psychiatric:        Mood and Affect: Mood normal.        Behavior: Behavior normal.     Lab Results  Component Value Date   WBC 10.0 06/12/2019   HGB 13.4 06/12/2019   HCT 41.2 06/12/2019   PLT 403 06/12/2019   GLUCOSE 100 (H) 06/12/2019   CHOL 145 06/12/2019   TRIG 121 06/12/2019   HDL 36 (L) 06/12/2019   LDLCALC 87 06/12/2019   ALT 19 06/12/2019   AST 18 06/12/2019   NA 139 06/12/2019   K 4.8 06/12/2019   CL 105 06/12/2019   CREATININE 0.94 06/12/2019   BUN 11 06/12/2019   CO2 20 06/12/2019   TSH 1.850 06/12/2019      Assessment & Plan:  1. Encounter for general adult medical examination with abnormal findings Discussed working eating healthy and exercise.  Recommended weight loss. Strongly recommend the use of prenatal vitamins since they are not using contraception. Recommend dentist visit twice a year. - CBC with Differential/Platelet - Comprehensive metabolic panel - Lipid panel - TSH - Cardiovascular Risk Assessment  2. Morbid obesity (HCC) Recommend eat healthy and exercise.  Recommend weight loss.  Increase water intake.  Consider food log. Education given. - phentermine  37.5 MG capsule; Take 1 capsule (37.5 mg total) by mouth every morning.  Dispense: 30 capsule; Refill: 0  3. BMI 40.0-44.9, adult (HCC) See above. - phentermine 37.5 MG capsule; Take 1 capsule (37.5 mg total) by mouth every morning.  Dispense: 30 capsule; Refill: 0   Follow-up: Return in about 4 weeks (around 07/10/2019) for diet/exercise.  An After Visit  Summary was printed and given to the patient.  Blane Ohara [facility] [phone]

## 2019-06-13 LAB — CBC WITH DIFFERENTIAL/PLATELET
Basophils Absolute: 0.1 10*3/uL (ref 0.0–0.2)
Basos: 1 %
EOS (ABSOLUTE): 0.4 10*3/uL (ref 0.0–0.4)
Eos: 4 %
Hematocrit: 41.2 % (ref 34.0–46.6)
Hemoglobin: 13.4 g/dL (ref 11.1–15.9)
Immature Grans (Abs): 0 10*3/uL (ref 0.0–0.1)
Immature Granulocytes: 0 %
Lymphocytes Absolute: 1.8 10*3/uL (ref 0.7–3.1)
Lymphs: 18 %
MCH: 25.9 pg — ABNORMAL LOW (ref 26.6–33.0)
MCHC: 32.5 g/dL (ref 31.5–35.7)
MCV: 80 fL (ref 79–97)
Monocytes Absolute: 0.8 10*3/uL (ref 0.1–0.9)
Monocytes: 8 %
Neutrophils Absolute: 6.9 10*3/uL (ref 1.4–7.0)
Neutrophils: 69 %
Platelets: 403 10*3/uL (ref 150–450)
RBC: 5.18 x10E6/uL (ref 3.77–5.28)
RDW: 14.4 % (ref 11.7–15.4)
WBC: 10 10*3/uL (ref 3.4–10.8)

## 2019-06-13 LAB — COMPREHENSIVE METABOLIC PANEL
ALT: 19 IU/L (ref 0–32)
AST: 18 IU/L (ref 0–40)
Albumin/Globulin Ratio: 1.4 (ref 1.2–2.2)
Albumin: 4.2 g/dL (ref 3.8–4.8)
Alkaline Phosphatase: 96 IU/L (ref 39–117)
BUN/Creatinine Ratio: 12 (ref 9–23)
BUN: 11 mg/dL (ref 6–20)
Bilirubin Total: 0.2 mg/dL (ref 0.0–1.2)
CO2: 20 mmol/L (ref 20–29)
Calcium: 9.3 mg/dL (ref 8.7–10.2)
Chloride: 105 mmol/L (ref 96–106)
Creatinine, Ser: 0.94 mg/dL (ref 0.57–1.00)
GFR calc Af Amer: 92 mL/min/{1.73_m2} (ref >59)
GFR calc non Af Amer: 79 mL/min/{1.73_m2} (ref >59)
Globulin, Total: 3.1 g/dL (ref 1.5–4.5)
Glucose: 100 mg/dL — ABNORMAL HIGH (ref 65–99)
Potassium: 4.8 mmol/L (ref 3.5–5.2)
Sodium: 139 mmol/L (ref 134–144)
Total Protein: 7.3 g/dL (ref 6.0–8.5)

## 2019-06-13 LAB — CARDIOVASCULAR RISK ASSESSMENT

## 2019-06-13 LAB — LIPID PANEL
Chol/HDL Ratio: 4 ratio (ref 0.0–4.4)
Cholesterol, Total: 145 mg/dL (ref 100–199)
HDL: 36 mg/dL — ABNORMAL LOW (ref >39)
LDL Chol Calc (NIH): 87 mg/dL (ref 0–99)
Triglycerides: 121 mg/dL (ref 0–149)
VLDL Cholesterol Cal: 22 mg/dL (ref 5–40)

## 2019-06-13 LAB — TSH: TSH: 1.85 u[IU]/mL (ref 0.450–4.500)

## 2019-07-08 NOTE — Progress Notes (Signed)
Cancelled. Kc  

## 2019-07-10 ENCOUNTER — Ambulatory Visit (INDEPENDENT_AMBULATORY_CARE_PROVIDER_SITE_OTHER): Payer: BC Managed Care – PPO | Admitting: Family Medicine

## 2019-07-10 DIAGNOSIS — Z6841 Body Mass Index (BMI) 40.0 and over, adult: Secondary | ICD-10-CM

## 2019-07-15 ENCOUNTER — Other Ambulatory Visit: Payer: Self-pay

## 2019-07-15 ENCOUNTER — Encounter: Payer: Self-pay | Admitting: Family Medicine

## 2019-07-15 ENCOUNTER — Ambulatory Visit: Payer: BC Managed Care – PPO | Admitting: Family Medicine

## 2019-07-15 DIAGNOSIS — Z6841 Body Mass Index (BMI) 40.0 and over, adult: Secondary | ICD-10-CM

## 2019-07-15 MED ORDER — PHENTERMINE HCL 37.5 MG PO CAPS: 37.5000 mg | ORAL_CAPSULE | ORAL | 1 refills | Status: DC

## 2019-07-15 NOTE — Patient Instructions (Signed)
Shirley Lewis [phone]

## 2019-07-15 NOTE — Progress Notes (Signed)
Established Patient Office Visit  Subjective:  Patient ID: Shirley Lewis, female    DOB: [birth year]  Age: 34 y.o. MRN: [id number]  CC:  Chief Complaint  Patient presents with  . Follow-up    HPI Shirley Lewis presents for weight management. Eating only once daily. Drinking water 4 bottles per day.  Phentermine. Dry mouth. No heart racing. Bp is fine. No insomnia.   Past Medical History:  Diagnosis Date  . Cyst of buttocks    Right spreading to right inner thigh  . History of kidney stones   . Panic attack     Past Surgical History:  Procedure Laterality Date  . CYST REMOVAL TRUNK Right 08/24/2017   Procedure: EXCISION SUBCUTANEOUS CYST RIGHT BUTTOCK/THIGH;  Surgeon: Clovis Riley, MD;  Location: [facility];  Service: General;  Laterality: Right;  . TONSILLECTOMY      Family History  Problem Relation Age of Onset  . Hypertension Maternal Grandmother   . Asthma Other   . COPD Other   . Hypertension Other   . Osteoarthritis Other   . Heart attack Other   . Cancer Other        Lung  . Mental illness Other     Social History   Socioeconomic History  . Marital status: Single    Spouse name: Not on file  . Number of children: 0  . Years of education: Not on file  . Highest education level: Not on file  Occupational History  . Occupation: Investment banker, corporate    Comment: [employer]  Tobacco Use  . Smoking status: Former Smoker    Quit date: 2012    Years since quitting: 9.4  . Smokeless tobacco: Never Used  Vaping Use  . Vaping Use: Never used  Substance and Sexual Activity  . Alcohol use: Yes    Comment: occasional  . Drug use: Never  . Sexual activity: Not on file  Other Topics Concern  . Not on file  Social History Narrative  . Not on file   Social Determinants of Health   Financial Resource Strain:   . Difficulty of Paying Living Expenses:   Food Insecurity:   . Worried About Charity fundraiser in the Last Year:   . Arboriculturist in  the Last Year:   Transportation Needs:   . Film/video editor (Medical):   Marland Kitchen Lack of Transportation (Non-Medical):   Physical Activity:   . Days of Exercise per Week:   . Minutes of Exercise per Session:   Stress:   . Feeling of Stress :   Social Connections:   . Frequency of Communication with Friends and Family:   . Frequency of Social Gatherings with Friends and Family:   . Attends Religious Services:   . Active Member of Clubs or Organizations:   . Attends Archivist Meetings:   Marland Kitchen Marital Status:   Intimate Partner Violence:   . Fear of Current or Ex-Partner:   . Emotionally Abused:   Marland Kitchen Physically Abused:   . Sexually Abused:     Outpatient Medications Prior to Visit  Medication Sig Dispense Refill  . phentermine 37.5 MG capsule Take 1 capsule (37.5 mg total) by mouth every morning. 30 capsule 0   No facility-administered medications prior to visit.    Allergies  Allergen Reactions  . Phenergan [Promethazine Hcl] Nausea And Vomiting    ROS Review of Systems  Constitutional: Negative for chills.  HENT: Negative for congestion, ear  pain and sore throat.   Respiratory: Negative for cough and shortness of breath.   Cardiovascular: Negative for chest pain.  Genitourinary: Negative for dysuria.  Neurological: Negative for dizziness and headaches.  Psychiatric/Behavioral: Negative for dysphoric mood and sleep disturbance.      Objective:    Physical Exam Vitals reviewed.  Constitutional:      Appearance: Normal appearance. She is well-developed. She is obese.  Cardiovascular:     Rate and Rhythm: Normal rate and regular rhythm.     Heart sounds: Normal heart sounds.  Pulmonary:     Effort: Pulmonary effort is normal.     Breath sounds: Normal breath sounds.  Abdominal:     General: Bowel sounds are normal.     Palpations: Abdomen is soft.     Tenderness: There is no abdominal tenderness.  Neurological:     Mental Status: She is alert.   Psychiatric:        Behavior: Behavior normal.     BP 112/70   Pulse 68   Temp (!) 97 F (36.1 C)   Resp 18   Ht 5' (1.524 m)   Wt 207 lb (93.9 kg)   BMI 40.43 kg/m  Wt Readings from Last 3 Encounters:  07/15/19 207 lb (93.9 kg)  06/12/19 215 lb (97.5 kg)  04/22/19 223 lb (101.2 kg)     Health Maintenance Due  Topic Date Due  . Hepatitis C Screening  Never done  . COVID-19 Vaccine (1) Never done  . HIV Screening  Never done  . TETANUS/TDAP  Never done  . PAP SMEAR-Modifier  Never done    There are no preventive care reminders to display for this patient.  Lab Results  Component Value Date   TSH 1.850 06/12/2019   Lab Results  Component Value Date   WBC 10.0 06/12/2019   HGB 13.4 06/12/2019   HCT 41.2 06/12/2019   MCV 80 06/12/2019   PLT 403 06/12/2019   Lab Results  Component Value Date   NA 139 06/12/2019   K 4.8 06/12/2019   CO2 20 06/12/2019   GLUCOSE 100 (H) 06/12/2019   BUN 11 06/12/2019   CREATININE 0.94 06/12/2019   BILITOT 0.2 06/12/2019   ALKPHOS 96 06/12/2019   AST 18 06/12/2019   ALT 19 06/12/2019   PROT 7.3 06/12/2019   ALBUMIN 4.2 06/12/2019   CALCIUM 9.3 06/12/2019   ANIONGAP 11 08/20/2017   Lab Results  Component Value Date   CHOL 145 06/12/2019   Lab Results  Component Value Date   HDL 36 (L) 06/12/2019   Lab Results  Component Value Date   LDLCALC 87 06/12/2019   Lab Results  Component Value Date   TRIG 121 06/12/2019   Lab Results  Component Value Date   CHOLHDL 4.0 06/12/2019   No results found for: HGBA1C    Assessment & Plan:   Problem List Items Addressed This Visit      Other   Morbid obesity (HCC)   Relevant Medications   phentermine 37.5 MG capsule   BMI 40.0-44.9, adult (HCC)   Relevant Medications   phentermine 37.5 MG capsule    Recommend continue to drink plenty of water. Recommended to eat 3 meals per day rather than one. Start exercising.  Meds ordered this encounter  Medications  .  phentermine 37.5 MG capsule    Sig: Take 1 capsule (37.5 mg total) by mouth every morning.    Dispense:  30 capsule  Refill:  1    Follow-up: Return in about 2 months (around 09/14/2019) for fasting.    Blane Ohara, MD

## 2019-07-22 ENCOUNTER — Other Ambulatory Visit: Payer: Self-pay | Admitting: Surgery

## 2019-07-22 DIAGNOSIS — Z5189 Encounter for other specified aftercare: Secondary | ICD-10-CM

## 2019-08-18 ENCOUNTER — Other Ambulatory Visit: Payer: Self-pay | Admitting: Surgery

## 2019-08-20 ENCOUNTER — Other Ambulatory Visit: Payer: Self-pay

## 2019-08-20 ENCOUNTER — Ambulatory Visit
Admission: RE | Admit: 2019-08-20 | Discharge: 2019-08-20 | Disposition: A | Discharge status: Home / Self Care | Payer: BC Managed Care – PPO | Source: Ambulatory Visit | Attending: Surgery | Admitting: Surgery

## 2019-08-20 DIAGNOSIS — Z5189 Encounter for other specified aftercare: Secondary | ICD-10-CM

## 2019-08-20 MED ORDER — GADOBENATE DIMEGLUMINE 529 MG/ML IV SOLN
18.0000 mL | Freq: Once | INTRAVENOUS | Status: AC | PRN
  Administered 2019-08-20: 18 mL via INTRAVENOUS

## 2019-09-08 ENCOUNTER — Ambulatory Visit: Payer: Self-pay | Admitting: General Surgery

## 2019-09-08 NOTE — H&P (Signed)
The patient is a 34 year old female who presents with a complaint of anal problems. Patient presented to the office with a boil on her buttock. She saw Dr. Doylene Canard and an excision was recommended. This was performed on 08/24/2017. The wound then opened up and began to drain. It was allowed to heal by secondary intention, but it never completely closed. 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 She is here for further evaluation. Patient reports chronic constipation and denies any loose stools. She has never had any hemorrhoid surgeries before.   Problem List/Past Medical Romie Levee, MD; 09/08/2019 3:46 PM) SUBCUTANEOUS CYST (L72.9) VISIT FOR WOUND CHECK (Z51.89) ANAL FISTULA (K60.3)  Past Surgical History Romie Levee, MD; 09/08/2019 3:46 PM) Tonsillectomy  Diagnostic Studies History Romie Levee, MD; 09/08/2019 3:46 PM) Colonoscopy never Mammogram never Pap Smear 1-5 years ago  Allergies Santiago Glad, CMA; 09/08/2019 3:22 PM) Phenergan *ANTIHISTAMINES* Allergies Reconciled  Medication History Santiago Glad, CMA; 09/08/2019 3:22 PM) No Current Medications Medications Reconciled  Social History Romie Levee, MD; 09/08/2019 3:46 PM) Alcohol use Occasional alcohol use. Caffeine use Tea. No drug use Tobacco use Former smoker.  Family History Romie Levee, MD; 09/08/2019 3:46 PM) Alcohol Abuse Father, Mother. Arthritis Father. Bleeding disorder Mother. Depression Mother. Ischemic Bowel Disease Mother.  Pregnancy / Birth History Romie Levee, MD; 09/08/2019 3:46 PM) Age at menarche 13 years. Gravida 0 Para 0 Regular periods  Other Problems Romie Levee, MD; 09/08/2019 3:46 PM) Kidney Larina Bras     Review of Systems Romie Levee MD; 09/08/2019 3:46 PM) General Present- Weight Gain. Not Present- Appetite Loss, Chills, Fatigue, Fever, Night Sweats and Weight Loss. Skin Present-  Non-Healing Wounds and Ulcer. Not Present- Change in Wart/Mole, Dryness, Hives, Jaundice, New Lesions and Rash. HEENT Present- Wears glasses/contact lenses. Not Present- Earache, Hearing Loss, Hoarseness, Nose Bleed, Oral Ulcers, Ringing in the Ears, Seasonal Allergies, Sinus Pain, Sore Throat, Visual Disturbances and Yellow Eyes. Respiratory Not Present- Bloody sputum, Chronic Cough, Difficulty Breathing, Snoring and Wheezing. Breast Not Present- Breast Mass, Breast Pain, Nipple Discharge and Skin Changes. Cardiovascular Present- Leg Cramps. Not Present- Chest Pain, Difficulty Breathing Lying Down, Palpitations, Rapid Heart Rate, Shortness of Breath and Swelling of Extremities. Gastrointestinal Not Present- Abdominal Pain, Bloating, Bloody Stool, Change in Bowel Habits, Chronic diarrhea, Constipation, Difficulty Swallowing, Excessive gas, Gets full quickly at meals, Hemorrhoids, Indigestion, Nausea, Rectal Pain and Vomiting. Female Genitourinary Not Present- Frequency, Nocturia, Painful Urination, Pelvic Pain and Urgency. Musculoskeletal Not Present- Back Pain, Joint Pain, Joint Stiffness, Muscle Pain, Muscle Weakness and Swelling of Extremities. Neurological Present- Numbness. Not Present- Decreased Memory, Fainting, Headaches, Seizures, Tingling, Tremor, Trouble walking and Weakness. Psychiatric Not Present- Anxiety, Bipolar, Change in Sleep Pattern, Depression, Fearful and Frequent crying. Endocrine Not Present- Cold Intolerance, Excessive Hunger, Hair Changes, Heat Intolerance, Hot flashes and New Diabetes. Hematology Not Present- Blood Thinners, Easy Bruising, Excessive bleeding, Gland problems, HIV and Persistent Infections.  Vitals Santiago Glad CMA; 09/08/2019 3:24 PM) 09/08/2019 3:21 PM Weight: 206 lb Height: 60in Body Surface Area: 1.89 m Body Mass Index: 40.23 kg/m  Temp.: 98.33F  Pulse: 113 (Regular)  BP: 140/88(Sitting, Left Arm, Standard)        Physical Exam  Romie Levee MD; 09/08/2019 3:44 PM)  General Mental Status-Alert. General Appearance-Cooperative. CV:RRR Lungs: CTA Abd: soft Rectal Anorectal Exam External - Note: Right anterior external opening consistent with MRI findings.    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 This is consistent with her physical exam. We discussed anal exam under anesthesia with possible fistulotomy versus seton placement. This will be determined by the amount of sphincter complex involved in the fistula tract. We have discussed each procedure in detail including the risk of incontinence with fistulotomy and the need for additional surgery, with seton placement. All questions were answered. We discussed recurrence rates with each procedure.

## 2019-09-16 ENCOUNTER — Ambulatory Visit: Payer: BC Managed Care – PPO | Admitting: Family Medicine

## 2019-10-07 ENCOUNTER — Ambulatory Visit: Payer: BC Managed Care – PPO | Admitting: Family Medicine

## 2019-10-08 ENCOUNTER — Encounter: Payer: Self-pay | Admitting: Family Medicine

## 2019-10-08 ENCOUNTER — Other Ambulatory Visit: Payer: Self-pay

## 2019-10-08 ENCOUNTER — Ambulatory Visit: Payer: BC Managed Care – PPO | Admitting: Family Medicine

## 2019-10-08 VITALS — BP 116/72 | HR 84 | Temp 97.2°F | Resp 16 | Ht 61.0 in | Wt 204.0 lb

## 2019-10-08 DIAGNOSIS — K604 Rectal fistula: Secondary | ICD-10-CM | POA: Diagnosis not present

## 2019-10-08 DIAGNOSIS — F410 Panic disorder [episodic paroxysmal anxiety] without agoraphobia: Secondary | ICD-10-CM

## 2019-10-08 DIAGNOSIS — K611 Rectal abscess: Secondary | ICD-10-CM | POA: Diagnosis not present

## 2019-10-08 MED ORDER — LORAZEPAM 0.5 MG PO TABS: 0.5000 mg | ORAL_TABLET | Freq: Every day | ORAL | 1 refills | Status: DC

## 2019-10-08 NOTE — Progress Notes (Signed)
Subjective:  Patient ID: Shirley Lewis, female    DOB: 27-Dec-1985  Age: 34 y.o. MRN: [id number]  Chief Complaint  Patient presents with  . Obesity   HPI Perirectal abscess/cyst with possible fistula. Patient has had an mri and seen the surgeon. Her surgery is scheduled 10/23/2019. Patient has been unable to work since 06/04/2019 due to pain when sitting. She is able to stand for short periods of time. There is no comfortable position. She is continuing to use warm salt baths.   Patient is very anxious. She stopped the phentermine as she was concerned it may have been contributing to her anxiety. Denies depression.   No current outpatient medications on file prior to visit.   No current facility-administered medications on file prior to visit.   Past Medical History:  Diagnosis Date  . Cyst of buttocks    Right spreading to right inner thigh  . History of kidney stones   . Panic attack    Past Surgical History:  Procedure Laterality Date  . CYST REMOVAL TRUNK Right 08/24/2017   Procedure: EXCISION SUBCUTANEOUS CYST RIGHT BUTTOCK/THIGH;  Surgeon: Berna Bue, MD;  Location: [facility] OR;  Service: General;  Laterality: Right;  . TONSILLECTOMY      Family History  Problem Relation Age of Onset  . Hypertension Maternal Grandmother   . Asthma Other   . COPD Other   . Hypertension Other   . Osteoarthritis Other   . Heart attack Other   . Cancer Other        Lung  . Mental illness Other    Social History   Socioeconomic History  . Marital status: Single    Spouse name: Not on file  . Number of children: 0  . Years of education: Not on file  . Highest education level: Not on file  Occupational History  . Occupation: Biomedical scientist    Comment: [employer]  Tobacco Use  . Smoking status: Former Smoker    Quit date: 2012    Years since quitting: 9.6  . Smokeless tobacco: Never Used  Vaping Use  . Vaping Use: Never used  Substance and Sexual Activity  .  Alcohol use: Yes    Comment: occasional  . Drug use: Never  . Sexual activity: Not on file  Other Topics Concern  . Not on file  Social History Narrative  . Not on file   Social Determinants of Health   Financial Resource Strain:   . Difficulty of Paying Living Expenses: Not on file  Food Insecurity:   . Worried About Programme researcher, broadcasting/film/video in the Last Year: Not on file  . Ran Out of Food in the Last Year: Not on file  Transportation Needs:   . Lack of Transportation (Medical): Not on file  . Lack of Transportation (Non-Medical): Not on file  Physical Activity:   . Days of Exercise per Week: Not on file  . Minutes of Exercise per Session: Not on file  Stress:   . Feeling of Stress : Not on file  Social Connections:   . Frequency of Communication with Friends and Family: Not on file  . Frequency of Social Gatherings with Friends and Family: Not on file  . Attends Religious Services: Not on file  . Active Member of Clubs or Organizations: Not on file  . Attends Banker Meetings: Not on file  . Marital Status: Not on file    Review of Systems  Constitutional: Negative for  chills, fatigue and fever.  HENT: Negative for congestion, ear pain and sore throat.   Respiratory: Negative for cough and shortness of breath.   Cardiovascular: Negative for chest pain.  Gastrointestinal: Negative for abdominal pain, constipation, diarrhea, nausea and vomiting.  Genitourinary: Negative for dysuria and urgency.  Musculoskeletal: Negative for arthralgias and myalgias.  Skin: Negative for rash.  Neurological: Negative for dizziness and headaches.  Psychiatric/Behavioral: Negative for dysphoric mood. The patient is not nervous/anxious.      Objective:  BP 116/72   Pulse 84   Temp (!) 97.2 F (36.2 C)   Resp 16   Ht 5\' 1"  (1.549 m)   Wt 204 lb (92.5 kg)   BMI 38.55 kg/m   BP/Weight 10/08/2019 07/15/2019 06/12/2019  Systolic BP 116 112 120  Diastolic BP 72 70 85  Wt. (Lbs)  204 207 215  BMI 38.55 40.43 41.99    Physical Exam Vitals reviewed.  Constitutional:      Appearance: Normal appearance. She is normal weight.  Cardiovascular:     Rate and Rhythm: Normal rate and regular rhythm.     Pulses: Normal pulses.     Heart sounds: Normal heart sounds.  Pulmonary:     Effort: Pulmonary effort is normal. No respiratory distress.     Breath sounds: Normal breath sounds.  Abdominal:     General: Abdomen is flat. Bowel sounds are normal.     Palpations: Abdomen is soft.     Tenderness: There is no abdominal tenderness.  Neurological:     Mental Status: She is alert and oriented to person, place, and time.  Psychiatric:        Mood and Affect: Mood normal.        Behavior: Behavior normal.     Lab Results  Component Value Date   WBC 10.0 06/12/2019   HGB 13.4 06/12/2019   HCT 41.2 06/12/2019   PLT 403 06/12/2019   GLUCOSE 100 (H) 06/12/2019   CHOL 145 06/12/2019   TRIG 121 06/12/2019   HDL 36 (L) 06/12/2019   LDLCALC 87 06/12/2019   ALT 19 06/12/2019   AST 18 06/12/2019   NA 139 06/12/2019   K 4.8 06/12/2019   CL 105 06/12/2019   CREATININE 0.94 06/12/2019   BUN 11 06/12/2019   CO2 20 06/12/2019   TSH 1.850 06/12/2019      Assessment & Plan:   1. Panic attack - LORazepam (ATIVAN) 0.5 MG tablet; Take 1 tablet (0.5 mg total) by mouth daily. Prn anxiety  Dispense: 30 tablet; Refill: 1  2. Perirectal abscess Scheduled for surgery in 10/2019. Unable to work due to pain.   3. Perirectal fistula  Scheduled for surgery in 10/2019. Unable to work due to pain.   Disability papers filled out.   Meds ordered this encounter  Medications  . LORazepam (ATIVAN) 0.5 MG tablet    Sig: Take 1 tablet (0.5 mg total) by mouth daily. Prn anxiety    Dispense:  30 tablet    Refill:  1     Follow-up: Return if symptoms worsen or fail to improve.  An After Visit Summary was printed and given to the patient.  11/2019 [facility] [phone]

## 2019-10-12 ENCOUNTER — Encounter: Payer: Self-pay | Admitting: Family Medicine

## 2019-10-20 ENCOUNTER — Other Ambulatory Visit (HOSPITAL_COMMUNITY)
Admission: RE | Admit: 2019-10-20 | Discharge: 2019-10-20 | Disposition: A | Discharge status: Home / Self Care | Payer: BC Managed Care – PPO | Source: Ambulatory Visit | Attending: General Surgery | Admitting: General Surgery

## 2019-10-20 ENCOUNTER — Other Ambulatory Visit: Payer: Self-pay

## 2019-10-20 ENCOUNTER — Encounter (HOSPITAL_BASED_OUTPATIENT_CLINIC_OR_DEPARTMENT_OTHER): Payer: Self-pay | Admitting: General Surgery

## 2019-10-20 DIAGNOSIS — Z20822 Contact with and (suspected) exposure to covid-19: Secondary | ICD-10-CM | POA: Diagnosis not present

## 2019-10-20 DIAGNOSIS — Z01812 Encounter for preprocedural laboratory examination: Secondary | ICD-10-CM | POA: Diagnosis present

## 2019-10-20 LAB — SARS CORONAVIRUS 2 (TAT 6-24 HRS): SARS Coronavirus 2: NEGATIVE

## 2019-10-20 NOTE — Progress Notes (Signed)
Spoke w/ via phone for pre-op interview--- Pt Lab needs dos---- Urine preg              Lab results------ no COVID test ------ 09-23-2019 @ 0830 Arrive at ------- 0830 NPO after MN NO Solid Food.  Clear liquids from MN until--- 0730 Medications to take morning of surgery ----- Ativan w/ sips of water Diabetic medication ----- n/a Patient Special Instructions ----- n/a Pre-Op special Istructions ----- n/a Patient verbalized understanding of instructions that were given at this phone interview. Patient denies shortness of breath, chest pain, fever, cough at this phone interview.

## 2019-10-23 ENCOUNTER — Ambulatory Visit (HOSPITAL_BASED_OUTPATIENT_CLINIC_OR_DEPARTMENT_OTHER): Payer: BC Managed Care – PPO | Admitting: Anesthesiology

## 2019-10-23 ENCOUNTER — Encounter (HOSPITAL_BASED_OUTPATIENT_CLINIC_OR_DEPARTMENT_OTHER): Payer: Self-pay | Admitting: General Surgery

## 2019-10-23 ENCOUNTER — Other Ambulatory Visit: Payer: Self-pay

## 2019-10-23 ENCOUNTER — Ambulatory Visit (HOSPITAL_BASED_OUTPATIENT_CLINIC_OR_DEPARTMENT_OTHER)
Admission: RE | Admit: 2019-10-23 | Discharge: 2019-10-23 | Disposition: A | Discharge status: Home / Self Care | Payer: BC Managed Care – PPO | Attending: General Surgery | Admitting: General Surgery

## 2019-10-23 ENCOUNTER — Encounter (HOSPITAL_BASED_OUTPATIENT_CLINIC_OR_DEPARTMENT_OTHER)
Admission: RE | Disposition: A | Discharge status: Home / Self Care | Payer: Self-pay | Source: Home / Self Care | Attending: General Surgery

## 2019-10-23 DIAGNOSIS — K610 Anal abscess: Secondary | ICD-10-CM | POA: Insufficient documentation

## 2019-10-23 DIAGNOSIS — Z6841 Body Mass Index (BMI) 40.0 and over, adult: Secondary | ICD-10-CM | POA: Diagnosis not present

## 2019-10-23 DIAGNOSIS — Z87891 Personal history of nicotine dependence: Secondary | ICD-10-CM | POA: Diagnosis not present

## 2019-10-23 DIAGNOSIS — K603 Anal fistula: Secondary | ICD-10-CM | POA: Diagnosis present

## 2019-10-23 HISTORY — PX: RECTAL EXAM UNDER ANESTHESIA: SHX6399

## 2019-10-23 HISTORY — DX: Anal fistula, unspecified: K60.30

## 2019-10-23 HISTORY — DX: Anxiety disorder, unspecified: F41.9

## 2019-10-23 HISTORY — DX: Presence of spectacles and contact lenses: Z97.3

## 2019-10-23 HISTORY — PX: FISTULOTOMY: SHX6413

## 2019-10-23 HISTORY — DX: Personal history of other mental and behavioral disorders: Z86.59

## 2019-10-23 HISTORY — DX: Anal fistula: K60.3

## 2019-10-23 LAB — POCT PREGNANCY, URINE: Preg Test, Ur: NEGATIVE

## 2019-10-23 SURGERY — EXAM UNDER ANESTHESIA, RECTUM
Anesthesia: General

## 2019-10-23 MED ORDER — SODIUM CHLORIDE 0.9 % IV SOLN: 250.0000 mL | INTRAVENOUS | Status: DC | PRN

## 2019-10-23 MED ORDER — SUCCINYLCHOLINE CHLORIDE 20 MG/ML IJ SOLN
INTRAMUSCULAR | Status: DC | PRN
  Administered 2019-10-23: 180 mg via INTRAVENOUS

## 2019-10-23 MED ORDER — ONDANSETRON HCL 4 MG/2ML IJ SOLN
INTRAMUSCULAR | Status: AC
  Filled 2019-10-23: qty 2

## 2019-10-23 MED ORDER — OXYCODONE HCL 5 MG PO TABS: 5.0000 mg | ORAL_TABLET | ORAL | Status: DC | PRN

## 2019-10-23 MED ORDER — MIDAZOLAM HCL 2 MG/2ML IJ SOLN
INTRAMUSCULAR | Status: AC
  Filled 2019-10-23: qty 2

## 2019-10-23 MED ORDER — SUCCINYLCHOLINE CHLORIDE 200 MG/10ML IV SOSY
PREFILLED_SYRINGE | INTRAVENOUS | Status: AC
  Filled 2019-10-23: qty 10

## 2019-10-23 MED ORDER — ACETAMINOPHEN 500 MG PO TABS
ORAL_TABLET | ORAL | Status: AC
  Filled 2019-10-23: qty 2

## 2019-10-23 MED ORDER — DEXAMETHASONE SODIUM PHOSPHATE 10 MG/ML IJ SOLN
INTRAMUSCULAR | Status: DC | PRN
  Administered 2019-10-23: 5 mg via INTRAVENOUS

## 2019-10-23 MED ORDER — ROCURONIUM BROMIDE 10 MG/ML (PF) SYRINGE
PREFILLED_SYRINGE | INTRAVENOUS | Status: DC | PRN
  Administered 2019-10-23: 25 mg via INTRAVENOUS

## 2019-10-23 MED ORDER — FENTANYL CITRATE (PF) 100 MCG/2ML IJ SOLN
INTRAMUSCULAR | Status: DC | PRN
Start: 2019-10-23 — End: 2019-10-23
  Administered 2019-10-23 (×2): 50 ug via INTRAVENOUS

## 2019-10-23 MED ORDER — ACETAMINOPHEN 500 MG PO TABS
1000.0000 mg | ORAL_TABLET | ORAL | Status: AC
  Administered 2019-10-23: 1000 mg via ORAL

## 2019-10-23 MED ORDER — SODIUM CHLORIDE 0.9% FLUSH: 3.0000 mL | INTRAVENOUS | Status: DC | PRN

## 2019-10-23 MED ORDER — DEXAMETHASONE SODIUM PHOSPHATE 10 MG/ML IJ SOLN
INTRAMUSCULAR | Status: AC
  Filled 2019-10-23: qty 1

## 2019-10-23 MED ORDER — ONDANSETRON HCL 4 MG PO TABS: 4.0000 mg | ORAL_TABLET | Freq: Three times a day (TID) | ORAL | 0 refills | Status: DC | PRN

## 2019-10-23 MED ORDER — LIDOCAINE 2% (20 MG/ML) 5 ML SYRINGE
INTRAMUSCULAR | Status: DC | PRN
  Administered 2019-10-23: 40 mg via INTRAVENOUS

## 2019-10-23 MED ORDER — BUPIVACAINE-EPINEPHRINE 0.5% -1:200000 IJ SOLN
INTRAMUSCULAR | Status: DC | PRN
  Administered 2019-10-23: 30 mL

## 2019-10-23 MED ORDER — PROPOFOL 500 MG/50ML IV EMUL
INTRAVENOUS | Status: AC
  Filled 2019-10-23: qty 50

## 2019-10-23 MED ORDER — CELECOXIB 200 MG PO CAPS
200.0000 mg | ORAL_CAPSULE | ORAL | Status: AC
  Administered 2019-10-23: 200 mg via ORAL

## 2019-10-23 MED ORDER — LIDOCAINE 2% (20 MG/ML) 5 ML SYRINGE
INTRAMUSCULAR | Status: AC
  Filled 2019-10-23: qty 5

## 2019-10-23 MED ORDER — ONDANSETRON HCL 4 MG/2ML IJ SOLN
INTRAMUSCULAR | Status: DC | PRN
  Administered 2019-10-23: 4 mg via INTRAVENOUS

## 2019-10-23 MED ORDER — SUGAMMADEX SODIUM 200 MG/2ML IV SOLN
INTRAVENOUS | Status: DC | PRN
  Administered 2019-10-23: 200 mg via INTRAVENOUS

## 2019-10-23 MED ORDER — FENTANYL CITRATE (PF) 100 MCG/2ML IJ SOLN: 25.0000 ug | INTRAMUSCULAR | Status: DC | PRN

## 2019-10-23 MED ORDER — PROPOFOL 10 MG/ML IV BOLUS
INTRAVENOUS | Status: DC | PRN
  Administered 2019-10-23: 40 mg via INTRAVENOUS
  Administered 2019-10-23: 150 mg via INTRAVENOUS

## 2019-10-23 MED ORDER — GABAPENTIN 300 MG PO CAPS
300.0000 mg | ORAL_CAPSULE | ORAL | Status: AC
  Administered 2019-10-23: 300 mg via ORAL

## 2019-10-23 MED ORDER — ACETAMINOPHEN 325 MG RE SUPP: 650.0000 mg | RECTAL | Status: DC | PRN

## 2019-10-23 MED ORDER — 0.9 % SODIUM CHLORIDE (POUR BTL) OPTIME
TOPICAL | Status: DC | PRN
  Administered 2019-10-23: 1000 mL

## 2019-10-23 MED ORDER — PROPOFOL 500 MG/50ML IV EMUL
INTRAVENOUS | Status: DC | PRN
  Administered 2019-10-23: 200 ug/kg/min via INTRAVENOUS

## 2019-10-23 MED ORDER — LACTATED RINGERS IV SOLN: INTRAVENOUS | Status: DC

## 2019-10-23 MED ORDER — HYDROCODONE-ACETAMINOPHEN 5-325 MG PO TABS: 1.0000 | ORAL_TABLET | Freq: Four times a day (QID) | ORAL | 0 refills | Status: DC | PRN

## 2019-10-23 MED ORDER — FENTANYL CITRATE (PF) 100 MCG/2ML IJ SOLN
INTRAMUSCULAR | Status: AC
Start: 2019-10-23 — End: ?
  Filled 2019-10-23: qty 2

## 2019-10-23 MED ORDER — ACETAMINOPHEN 325 MG PO TABS: 650.0000 mg | ORAL_TABLET | ORAL | Status: DC | PRN

## 2019-10-23 MED ORDER — CELECOXIB 200 MG PO CAPS
ORAL_CAPSULE | ORAL | Status: AC
  Filled 2019-10-23: qty 1

## 2019-10-23 MED ORDER — PROPOFOL 10 MG/ML IV BOLUS
INTRAVENOUS | Status: AC
  Filled 2019-10-23: qty 20

## 2019-10-23 MED ORDER — MIDAZOLAM HCL 2 MG/2ML IJ SOLN
INTRAMUSCULAR | Status: DC | PRN
  Administered 2019-10-23 (×2): 1 mg via INTRAVENOUS

## 2019-10-23 MED ORDER — SODIUM CHLORIDE 0.9% FLUSH: 3.0000 mL | Freq: Two times a day (BID) | INTRAVENOUS | Status: DC

## 2019-10-23 MED ORDER — GABAPENTIN 300 MG PO CAPS
ORAL_CAPSULE | ORAL | Status: AC
  Filled 2019-10-23: qty 1

## 2019-10-23 SURGICAL SUPPLY — 50 items
APL SKNCLS STERI-STRIP NONHPOA (GAUZE/BANDAGES/DRESSINGS) ×2
BENZOIN TINCTURE PRP APPL 2/3 (GAUZE/BANDAGES/DRESSINGS) ×4 IMPLANT
BLADE EXTENDED COATED 6.5IN (ELECTRODE) IMPLANT
BLADE HEX COATED 2.75 (ELECTRODE) ×2 IMPLANT
BLADE SURG 10 STRL SS (BLADE) IMPLANT
BRIEF STRETCH FOR OB PAD LRG (UNDERPADS AND DIAPERS) ×2 IMPLANT
CANISTER SUCT 3000ML PPV (MISCELLANEOUS) ×2 IMPLANT
COVER BACK TABLE 60X90IN (DRAPES) ×2 IMPLANT
COVER MAYO STAND STRL (DRAPES) ×2 IMPLANT
COVER WAND RF STERILE (DRAPES) ×2 IMPLANT
DECANTER SPIKE VIAL GLASS SM (MISCELLANEOUS) ×2 IMPLANT
DRAPE LAPAROTOMY 100X72 PEDS (DRAPES) ×2 IMPLANT
DRAPE UTILITY XL STRL (DRAPES) ×2 IMPLANT
DRSG PAD ABDOMINAL 8X10 ST (GAUZE/BANDAGES/DRESSINGS) ×2 IMPLANT
ELECT REM PT RETURN 9FT ADLT (ELECTROSURGICAL) ×2
ELECTRODE REM PT RTRN 9FT ADLT (ELECTROSURGICAL) ×1 IMPLANT
GAUZE SPONGE 4X4 12PLY STRL (GAUZE/BANDAGES/DRESSINGS) ×2 IMPLANT
GLOVE BIO SURGEON STRL SZ 6.5 (GLOVE) ×2 IMPLANT
GLOVE BIOGEL PI IND STRL 7.0 (GLOVE) ×1 IMPLANT
GLOVE BIOGEL PI INDICATOR 7.0 (GLOVE) ×1
GOWN STRL REUS W/TWL XL LVL3 (GOWN DISPOSABLE) ×2 IMPLANT
HYDROGEN PEROXIDE 16OZ (MISCELLANEOUS) IMPLANT
IV CATH 14GX2 1/4 (CATHETERS) ×2 IMPLANT
IV CATH 18G SAFETY (IV SOLUTION) ×2 IMPLANT
KIT SIGMOIDOSCOPE (SET/KITS/TRAYS/PACK) IMPLANT
KIT TURNOVER CYSTO (KITS) ×2 IMPLANT
LOOP VESSEL MAXI BLUE (MISCELLANEOUS) IMPLANT
NEEDLE HYPO 22GX1.5 SAFETY (NEEDLE) ×2 IMPLANT
NS IRRIG 500ML POUR BTL (IV SOLUTION) ×2 IMPLANT
PACK BASIN DAY SURGERY FS (CUSTOM PROCEDURE TRAY) ×2 IMPLANT
PAD ARMBOARD 7.5X6 YLW CONV (MISCELLANEOUS) IMPLANT
PENCIL SMOKE EVACUATOR (MISCELLANEOUS) ×2 IMPLANT
SPONGE HEMORRHOID 8X3CM (HEMOSTASIS) IMPLANT
SPONGE SURGIFOAM ABS GEL 12-7 (HEMOSTASIS) IMPLANT
SUCTION FRAZIER HANDLE 10FR (MISCELLANEOUS)
SUCTION TUBE FRAZIER 10FR DISP (MISCELLANEOUS) IMPLANT
SUT CHROMIC 2 0 SH (SUTURE) ×2 IMPLANT
SUT CHROMIC 3 0 SH 27 (SUTURE) IMPLANT
SUT ETHIBOND 0 (SUTURE) IMPLANT
SUT VIC AB 2-0 SH 27 (SUTURE)
SUT VIC AB 2-0 SH 27XBRD (SUTURE) IMPLANT
SUT VIC AB 3-0 SH 18 (SUTURE) IMPLANT
SUT VIC AB 3-0 SH 27 (SUTURE) ×6
SUT VIC AB 3-0 SH 27X BRD (SUTURE) ×1 IMPLANT
SUT VIC AB 3-0 SH 27XBRD (SUTURE) ×2 IMPLANT
SYR CONTROL 10ML LL (SYRINGE) ×2 IMPLANT
TOWEL OR 17X26 10 PK STRL BLUE (TOWEL DISPOSABLE) ×2 IMPLANT
TRAY DSU PREP LF (CUSTOM PROCEDURE TRAY) ×2 IMPLANT
TUBE CONNECTING 12X1/4 (SUCTIONS) ×2 IMPLANT
YANKAUER SUCT BULB TIP NO VENT (SUCTIONS) ×2 IMPLANT

## 2019-10-23 NOTE — Discharge Instructions (Addendum)
ANORECTAL SURGERY: POST OP INSTRUCTIONS 1. Take your usually prescribed home medications unless otherwise directed. 2. DIET: During the first few hours after surgery sip on some liquids until you are able to urinate.  It is normal to not urinate for several hours after this surgery.  If you feel uncomfortable, please contact the office for instructions.  After you are able to urinate,you may eat, if you feel like it.  Follow a light bland diet the first 24 hours after arrival home, such as soup, liquids, crackers, etc.  Be sure to include lots of fluids daily (6-8 glasses).  Avoid fast food or heavy meals, as your are more likely to get nauseated.  Eat a low fat diet the next few days after surgery.  Limit caffeine intake to 1-2 servings a day. 3. PAIN CONTROL: a. Pain is best controlled by a usual combination of several different methods TOGETHER: i. Muscle relaxation: Soak in a warm bath (or Sitz bath) three times a day and after bowel movements.  Continue to do this until all pain is resolved. ii. Over the counter pain medication iii. Prescription pain medication b. Most patients will experience some swelling and discomfort in the anus/rectal area and incisions.  Heat such as warm towels, sitz baths, warm baths, etc to help relax tight/sore spots and speed recovery.  Some people prefer to use ice, especially in the first couple days after surgery, as it may decrease the pain and swelling, or alternate between ice & heat.  Experiment to what works for you.  Swelling and bruising can take several weeks to resolve.  Pain can take even longer to completely resolve. c. It is helpful to take an over-the-counter pain medication regularly for the first few weeks.  Choose one of the following that works best for you: i. Naproxen (Aleve, etc)  Two 220mg tabs twice a day ii. Ibuprofen (Advil, etc) Three 200mg tabs four times a day (every meal & bedtime) d. A  prescription for pain medication (such as percocet,  oxycodone, hydrocodone, etc) should be given to you upon discharge.  Take your pain medication as prescribed.  i. If you are having problems/concerns with the prescription medicine (does not control pain, nausea, vomiting, rash, itching, etc), please call us [phone] to see if we need to switch you to a different pain medicine that will work better for you and/or control your side effect better. ii. If you need a refill on your pain medication, please contact your pharmacy.  They will contact our office to request authorization. Prescriptions will not be filled after 5 pm or on week-ends. 4. KEEP YOUR BOWELS REGULAR and AVOID CONSTIPATION a. The goal is one to two soft bowel movements a day.  You should at least have a bowel movement every other day. b. Avoid getting constipated.  Between the surgery and the pain medications, it is common to experience some constipation. This can be very painful after rectal surgery.  Increasing fluid intake and taking a fiber supplement (such as Metamucil, Citrucel, FiberCon, etc) 1-2 times a day regularly will usually help prevent this problem from occurring.  A stool softener like colace is also recommended.  This can be purchased over the counter at your pharmacy.  You can take it up to 3 times a day.  If you do not have a bowel movement after 24 hrs since your surgery, take one does of milk of magnesia.  If you still haven't had a bowel movement 8-12 hours after   that dose, take another dose.  If you don't have a bowel movement 48 hrs after surgery, purchase a Fleets enema from the drug store and administer gently per package instructions.  If you still are having trouble with your bowel movements after that, please call the office for further instructions. c. If you develop diarrhea or have many loose bowel movements, simplify your diet to bland foods & liquids for a few days.  Stop any stool softeners and decrease your fiber supplement.  Switching to mild  anti-diarrheal medications (Kayopectate, Pepto Bismol) can help.  If this worsens or does not improve, please call us.  5. Wound Care a. Remove your bandages before your first bowel movement or 8 hours after surgery.     b. Remove any wound packing material at this tim,e as well.  You do not need to repack the wound unless instructed otherwise.  Wear an absorbent pad or soft cotton gauze in your underwear to catch any drainage and help keep the area clean. You should change this every 2-3 hours while awake. c. Keep the area clean and dry.  Bathe / shower every day, especially after bowel movements.  Keep the area clean by showering / bathing over the incision / wound.   It is okay to soak an open wound to help wash it.  Wet wipes or showers / gentle washing after bowel movements is often less traumatic than regular toilet paper. d. You may have some styrofoam-like soft packing in the rectum which will come out with the first bowel movement.  e. You will often notice bleeding with bowel movements.  This should slow down by the end of the first week of surgery f. Expect some drainage.  This should slow down, too, by the end of the first week of surgery.  Wear an absorbent pad or soft cotton gauze in your underwear until the drainage stops. g. Do Not sit on a rubber or pillow ring.  This can make you symptoms worse.  You may sit on a soft pillow if needed.  6. ACTIVITIES as tolerated:   a. You may resume regular (light) daily activities beginning the next day--such as daily self-care, walking, climbing stairs--gradually increasing activities as tolerated.  If you can walk 30 minutes without difficulty, it is safe to try more intense activity such as jogging, treadmill, bicycling, low-impact aerobics, swimming, etc. b. Save the most intensive and strenuous activity for last such as sit-ups, heavy lifting, contact sports, etc  Refrain from any heavy lifting or straining until you are off narcotics for pain  control.   c. You may drive when you are no longer taking prescription pain medication, you can comfortably sit for long periods of time, and you can safely maneuver your car and apply brakes. d. You may have sexual intercourse when it is comfortable.  7. FOLLOW UP in our office a. Please call [facility] at [phone] to set up an appointment to see your surgeon in the office for a follow-up appointment approximately 3-4 weeks after your surgery. b. Make sure that you call for this appointment the day you arrive home to insure a convenient appointment time. 10. IF YOU HAVE DISABILITY OR FAMILY LEAVE FORMS, BRING THEM TO THE OFFICE FOR PROCESSING.  DO NOT GIVE THEM TO YOUR DOCTOR.     WHEN TO CALL US [phone]: 1. Poor pain control 2. Reactions / problems with new medications (rash/itching, nausea, etc)  3. Fever over 101.5 F (38.5 C) 4.   Inability to urinate 5. Nausea and/or vomiting 6. Worsening swelling or bruising 7. Continued bleeding from incision. 8. Increased pain, redness, or drainage from the incision  The clinic staff is available to answer your questions during regular business hours (8:30am-5pm).  Please don't hesitate to call and ask to speak to one of our nurses for clinical concerns.   A surgeon from [facility] is always on call at the hospitals   If you have a medical emergency, go to the nearest emergency room or call 911.    [facility] [address], [place], [place]  [address] ? MAIN: (336) [phone] ? TOLL FREE: [phone] ? FAX [phone] [email or web address]   Post Anesthesia Home Care Instructions  Activity: Get plenty of rest for the remainder of the day. A responsible adult should stay with you for 24 hours following the procedure.  For the next 24 hours, DO NOT: -Drive a car -Advertising copywriter -Drink alcoholic beverages -Take any medication unless instructed by your physician -Make  any legal decisions or sign important papers.  Meals: Start with liquid foods such as gelatin or soup. Progress to regular foods as tolerated. Avoid greasy, spicy, heavy foods. If nausea and/or vomiting occur, drink only clear liquids until the nausea and/or vomiting subsides. Call your physician if vomiting continues.  Special Instructions/Symptoms: Your throat may feel dry or sore from the anesthesia or the breathing tube placed in your throat during surgery. If this causes discomfort, gargle with warm salt water. The discomfort should disappear within 24 hours.  If you had a scopolamine patch placed behind your ear for the management of post- operative nausea and/or vomiting:  1. The medication in the patch is effective for 72 hours, after which it should be removed.  Wrap patch in a tissue and discard in the trash. Wash hands thoroughly with soap and water. 2. You may remove the patch earlier than 72 hours if you experience unpleasant side effects which may include dry mouth, dizziness or visual disturbances. 3. Avoid touching the patch. Wash your hands with soap and water after contact with the patch.   NO ADVIL, ALEVE, MOTRIN, IBUPROFEN UNTIL 3 PM TODAY

## 2019-10-23 NOTE — H&P (Signed)
The patient is a 34 year old female who presents with a complaint of anal problems. Patient presented to the office with a boil on her buttock. She saw Dr. Doylene Canard and an excision was recommended. This was performed on 08/24/2017. The wound then opened up and began to drain. It was allowed to heal by secondary intention, but it never completely closed. 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 She is here for further evaluation. Patient reports chronic constipation and denies any loose stools. She has never had any hemorrhoid surgeries before.   Problem List/Past Medical Shirley Levee, MD; 09/08/2019 3:46 PM) SUBCUTANEOUS CYST (L72.9) VISIT FOR WOUND CHECK (Z51.89) ANAL FISTULA (K60.3)  Past Surgical History Shirley Levee, MD; 09/08/2019 3:46 PM) Tonsillectomy  Diagnostic Studies History Shirley Levee, MD; 09/08/2019 3:46 PM) Colonoscopy never Mammogram never Pap Smear 1-5 years ago  Allergies Santiago Glad, CMA; 09/08/2019 3:22 PM) Phenergan *ANTIHISTAMINES* Allergies Reconciled  Medication History Santiago Glad, CMA; 09/08/2019 3:22 PM) No Current Medications Medications Reconciled  Social History Shirley Levee, MD; 09/08/2019 3:46 PM) Alcohol use Occasional alcohol use. Caffeine use Tea. No drug use Tobacco use Former smoker.  Family History Shirley Levee, MD; 09/08/2019 3:46 PM) Alcohol Abuse Father, Mother. Arthritis Father. Bleeding disorder Mother. Depression Mother. Ischemic Bowel Disease Mother.  Pregnancy / Birth History Shirley Levee, MD; 09/08/2019 3:46 PM) Age at menarche 13 years. Gravida 0 Para 0 Regular periods  Other Problems Shirley Levee, MD; 09/08/2019 3:46 PM) Kidney Larina Lewis     Review of Systems Shirley Levee MD; 09/08/2019 3:46 PM) General Present- Weight Gain. Not Present- Appetite Loss, Chills, Fatigue, Fever, Night Sweats and Weight Loss. Skin  Present- Non-Healing Wounds and Ulcer. Not Present- Change in Wart/Mole, Dryness, Hives, Jaundice, New Lesions and Rash. HEENT Present- Wears glasses/contact lenses. Not Present- Earache, Hearing Loss, Hoarseness, Nose Bleed, Oral Ulcers, Ringing in the Ears, Seasonal Allergies, Sinus Pain, Sore Throat, Visual Disturbances and Yellow Eyes. Respiratory Not Present- Bloody sputum, Chronic Cough, Difficulty Breathing, Snoring and Wheezing. Breast Not Present- Breast Mass, Breast Pain, Nipple Discharge and Skin Changes. Cardiovascular Present- Leg Cramps. Not Present- Chest Pain, Difficulty Breathing Lying Down, Palpitations, Rapid Heart Rate, Shortness of Breath and Swelling of Extremities. Gastrointestinal Not Present- Abdominal Pain, Bloating, Bloody Stool, Change in Bowel Habits, Chronic diarrhea, Constipation, Difficulty Swallowing, Excessive gas, Gets full quickly at meals, Hemorrhoids, Indigestion, Nausea, Rectal Pain and Vomiting. Female Genitourinary Not Present- Frequency, Nocturia, Painful Urination, Pelvic Pain and Urgency. Musculoskeletal Not Present- Back Pain, Joint Pain, Joint Stiffness, Muscle Pain, Muscle Weakness and Swelling of Extremities. Neurological Present- Numbness. Not Present- Decreased Memory, Fainting, Headaches, Seizures, Tingling, Tremor, Trouble walking and Weakness. Psychiatric Not Present- Anxiety, Bipolar, Change in Sleep Pattern, Depression, Fearful and Frequent crying. Endocrine Not Present- Cold Intolerance, Excessive Hunger, Hair Changes, Heat Intolerance, Hot flashes and New Diabetes. Hematology Not Present- Blood Thinners, Easy Bruising, Excessive bleeding, Gland problems, HIV and Persistent Infections.  Ht 5\' 1"  (1.549 m)   Wt 93.4 kg   LMP 08/25/2019 (Approximate)   BMI 38.92 kg/m     Physical Exam  General Mental Status-Alert. General Appearance-Cooperative. CV:RRR Lungs: CTA Abd: soft Rectal Anorectal Exam External - Note: Right anterior  external opening consistent with MRI findings.    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 This is consistent with her physical exam. We discussed anal exam under anesthesia with possible fistulotomy versus seton  placement. This will be determined by the amount of sphincter complex involved in the fistula tract. We have discussed each procedure in detail including the risk of incontinence with fistulotomy and the need for additional surgery, with seton placement. All questions were answered. We discussed recurrence rates with each procedure.

## 2019-10-23 NOTE — Op Note (Signed)
10/23/2019  10:48 AM  PATIENT:  Shirley Lewis  34 y.o. female  Patient Care Team: Rochel Brome, MD as PCP - General (Family Medicine)  PRE-OPERATIVE DIAGNOSIS:  ANAL FISTULA  POST-OPERATIVE DIAGNOSIS:  ANAL FISTULA  PROCEDURE:   ANAL EXAM UNDER ANESTHESIA FISTULOTOMY    Surgeon(s): Leighton Ruff, MD  ASSISTANT: none   ANESTHESIA:   local and general  SPECIMEN:  No Specimen  DISPOSITION OF SPECIMEN:  PATHOLOGY  COUNTS:  YES  PLAN OF CARE: Discharge to home after PACU  PATIENT DISPOSITION:  PACU - hemodynamically stable.  INDICATION: 34 y.o. F with recurrent anal abscess and MRI showing anal fistula   OR FINDINGS: R anterior anal fistula involving external sphincter only  DESCRIPTION: the patient was identified in the preoperative holding area and taken to the OR where they were laid on the operating room table.  MAC anesthesia was induced without difficulty. The patient was then positioned in prone jackknife position with buttocks gently taped apart.  The patient was then prepped and draped in usual sterile fashion.  SCDs were noted to be in place prior to the initiation of anesthesia. A surgical timeout was performed indicating the correct patient, procedure, positioning and need for preoperative antibiotics.  A rectal block was performed using Marcaine with epinephrine.  At this point the patient began to have trouble with her airway.  We decided to abort the procedure and we returned her to the supine position.  She was then intubated by anesthesia after regaining control of her airway.  She was then placed back into the prone jackknife position with buttocks taped gently apart.  I began with a digital rectal exam.  The patient had good sphincter tone.  I then placed a Hill-Ferguson anoscope into the anal canal and evaluated this completely.  There was no internal hemorrhoid disease.  An internal opening could be seen at the distal anal canal proximal to the internal  sphincter.  A fistula probe was placed into this area and traversed down the intersphincteric groove.  I then placed a fistula probe into the external opening and this also traversed down underneath the external sphincter.  Given that only external sphincter was involved and this was an anterior fistula I felt the only option to repair this would be a fistulotomy.  I divided this using electrocautery.  I then tacked the external sphincter cut edges to the fistula tract using interrupted 3-0 Vicryl sutures.  The skin edges of the tract were marsupialized using a running 2-0 chromic suture.  Hemostasis was good.  Lidocaine ointment and sterile dressing were applied.  The patient was awakened from anesthesia and sent to the post anesthesia care unit in stable condition.  All counts were correct per operating room staff.

## 2019-10-23 NOTE — Transfer of Care (Signed)
Immediate Anesthesia Transfer of Care Note  Patient: Shirley Lewis  Procedure(s) Performed: Procedure(s) (LRB): RECTAL EXAM UNDER ANESTHESIA (N/A) FISTULOTOMY (N/A)  Patient Location: PACU  Anesthesia Type: General  Level of Consciousness: awake, oriented, sedated and patient cooperative  Airway & Oxygen Therapy: Patient Spontanous Breathing and Patient connected to face mask oxygen  Post-op Assessment: Report given to PACU RN and Post -op Vital signs reviewed and stable  Post vital signs: Reviewed and stable  Complications: No apparent anesthesia complications Last Vitals:  Vitals Value Taken Time  BP 126/71 10/23/19 1100  Temp    Pulse 106 10/23/19 1104  Resp 19 10/23/19 1104  SpO2 100 % 10/23/19 1104  Vitals shown include unvalidated device data.  Last Pain:  Vitals:   10/23/19 0858  TempSrc: Oral  PainSc: 0-No pain      Patients Stated Pain Goal: 4 (10/23/19 0858)  Complications: No complications documented.

## 2019-10-23 NOTE — Anesthesia Procedure Notes (Signed)
Procedure Name: Intubation Date/Time: 10/23/2019 10:10 AM Performed by: Suan Halter, CRNA Pre-anesthesia Checklist: Patient identified, Emergency Drugs available, Suction available and Patient being monitored Patient Re-evaluated:Patient Re-evaluated prior to induction Oxygen Delivery Method: Circle system utilized Preoxygenation: Pre-oxygenation with 100% oxygen Induction Type: IV induction Ventilation: Mask ventilation without difficulty Laryngoscope Size: Mac and 3 Grade View: Grade I Tube type: Oral Tube size: 7.0 mm Number of attempts: 1 Airway Equipment and Method: Stylet and Oral airway Placement Confirmation: ETT inserted through vocal cords under direct vision,  positive ETCO2 and breath sounds checked- equal and bilateral Secured at: 22 cm Tube secured with: Tape Dental Injury: Teeth and Oropharynx as per pre-operative assessment

## 2019-10-23 NOTE — Anesthesia Postprocedure Evaluation (Signed)
Anesthesia Post Note  Patient: Equities trader  Procedure(s) Performed: RECTAL EXAM UNDER ANESTHESIA (N/A ) FISTULOTOMY (N/A )     Patient location during evaluation: PACU Anesthesia Type: General Level of consciousness: awake and alert Pain management: pain level controlled Vital Signs Assessment: post-procedure vital signs reviewed and stable Respiratory status: spontaneous breathing, nonlabored ventilation, respiratory function stable and patient connected to nasal cannula oxygen Cardiovascular status: blood pressure returned to baseline and stable Postop Assessment: no apparent nausea or vomiting Anesthetic complications: no   No complications documented.  Last Vitals:  Vitals:   10/23/19 1115 10/23/19 1130  BP: 123/79 128/71  Pulse: 93 98  Resp: 20 (!) 25  Temp:    SpO2: 100% 98%    Last Pain:  Vitals:   10/23/19 1130  TempSrc:   PainSc: 3                  Laraina Sulton L Cadie Sorci

## 2019-10-23 NOTE — Anesthesia Preprocedure Evaluation (Addendum)
Anesthesia Evaluation  Patient identified by MRN, date of birth, ID band Patient awake    Reviewed: Allergy & Precautions, NPO status , Patient's Chart, lab work & pertinent test results  Airway Mallampati: II  TM Distance: >3 FB Neck ROM: Full    Dental no notable dental hx. (+) Teeth Intact, Dental Advisory Given   Pulmonary neg pulmonary ROS, former smoker,    Pulmonary exam normal breath sounds clear to auscultation       Cardiovascular negative cardio ROS Normal cardiovascular exam Rhythm:Regular Rate:Normal     Neuro/Psych PSYCHIATRIC DISORDERS Anxiety negative neurological ROS     GI/Hepatic negative GI ROS, Neg liver ROS,   Endo/Other  Morbid obesity (BMI 41)  Renal/GU negative Renal ROS  negative genitourinary   Musculoskeletal negative musculoskeletal ROS (+)   Abdominal   Peds  Hematology negative hematology ROS (+)   Anesthesia Other Findings Anal fistula  Reproductive/Obstetrics                            Anesthesia Physical Anesthesia Plan  ASA: III  Anesthesia Plan: MAC   Post-op Pain Management:    Induction: Intravenous  PONV Risk Score and Plan: 2 and Propofol infusion, Treatment may vary due to age or medical condition and Midazolam  Airway Management Planned: Natural Airway  Additional Equipment:   Intra-op Plan:   Post-operative Plan:   Informed Consent: I have reviewed the patients History and Physical, chart, labs and discussed the procedure including the risks, benefits and alternatives for the proposed anesthesia with the patient or authorized representative who has indicated his/her understanding and acceptance.     Dental advisory given  Plan Discussed with: CRNA  Anesthesia Plan Comments:         Anesthesia Quick Evaluation

## 2019-10-24 ENCOUNTER — Encounter (HOSPITAL_BASED_OUTPATIENT_CLINIC_OR_DEPARTMENT_OTHER): Payer: Self-pay | Admitting: General Surgery

## 2019-11-27 ENCOUNTER — Other Ambulatory Visit: Payer: Self-pay

## 2019-11-27 ENCOUNTER — Ambulatory Visit: Payer: BC Managed Care – PPO | Admitting: Family Medicine

## 2019-11-27 VITALS — BP 110/76 | HR 88 | Temp 97.7°F | Resp 16 | Ht 60.0 in | Wt 211.2 lb

## 2019-11-27 DIAGNOSIS — K604 Rectal fistula: Secondary | ICD-10-CM | POA: Diagnosis not present

## 2019-11-27 DIAGNOSIS — K611 Rectal abscess: Secondary | ICD-10-CM

## 2019-11-27 NOTE — Progress Notes (Signed)
Subjective:  Patient ID: Shirley Lewis, female    DOB: Jun 21, 1985  Age: 34 y.o. MRN: [id number]  Chief Complaint  Patient presents with  . Follow-up: postop abscess/fistula excision.   HPI Perirectal abscess/cyst with possible fistula. Patient has had her surgery (10/23/2019.)  Patient has been unable to work since 06/04/2019 due to pain when sitting. She is able to stand for short periods of time. There is no comfortable position. She is recovering fairly well from her surgery.  She is ready to go back to work.  Current Outpatient Medications on File Prior to Visit  Medication Sig Dispense Refill  . LORazepam (ATIVAN) 0.5 MG tablet Take 1 tablet (0.5 mg total) by mouth daily. Prn anxiety 30 tablet 1   No current facility-administered medications on file prior to visit.   Past Medical History:  Diagnosis Date  . Anal fistula   . Anxiety   . History of kidney stones   . History of panic attacks   . Wears contact lenses    Past Surgical History:  Procedure Laterality Date  . CYST REMOVAL TRUNK Right 08/24/2017   Procedure: EXCISION SUBCUTANEOUS CYST RIGHT BUTTOCK/THIGH;  Surgeon: Berna Bue, MD;  Location: [facility];  Service: General;  Laterality: Right;  . FISTULOTOMY N/A 10/23/2019   Procedure: FISTULOTOMY;  Surgeon: Romie Levee, MD;  Location: [facility];  Service: General;  Laterality: N/A;  . RECTAL EXAM UNDER ANESTHESIA N/A 10/23/2019   Procedure: RECTAL EXAM UNDER ANESTHESIA;  Surgeon: Romie Levee, MD;  Location: [facility];  Service: General;  Laterality: N/A;  . TONSILLECTOMY  age 17    Family History  Problem Relation Age of Onset  . Hypertension Maternal Grandmother   . Asthma Other   . COPD Other   . Hypertension Other   . Osteoarthritis Other   . Heart attack Other   . Cancer Other        Lung  . Mental illness Other    Social History   Socioeconomic History  . Marital status: Single    Spouse name: Not on file  .  Number of children: 0  . Years of education: Not on file  . Highest education level: Not on file  Occupational History  . Occupation: Biomedical scientist    Comment: [employer]  Tobacco Use  . Smoking status: Former Smoker    Years: 10.00    Types: Cigarettes    Quit date: 02/21/2008    Years since quitting: 11.7  . Smokeless tobacco: Never Used  Vaping Use  . Vaping Use: Never used  Substance and Sexual Activity  . Alcohol use: Not Currently    Comment: occasional  . Drug use: Never  . Sexual activity: Not on file  Other Topics Concern  . Not on file  Social History Narrative  . Not on file   Social Determinants of Health   Financial Resource Strain:   . Difficulty of Paying Living Expenses: Not on file  Food Insecurity:   . Worried About Programme researcher, broadcasting/film/video in the Last Year: Not on file  . Ran Out of Food in the Last Year: Not on file  Transportation Needs:   . Lack of Transportation (Medical): Not on file  . Lack of Transportation (Non-Medical): Not on file  Physical Activity:   . Days of Exercise per Week: Not on file  . Minutes of Exercise per Session: Not on file  Stress:   . Feeling of Stress :  Not on file  Social Connections:   . Frequency of Communication with Friends and Family: Not on file  . Frequency of Social Gatherings with Friends and Family: Not on file  . Attends Religious Services: Not on file  . Active Member of Clubs or Organizations: Not on file  . Attends Banker Meetings: Not on file  . Marital Status: Not on file    Review of Systems  Constitutional: Negative for chills, fatigue and fever.  HENT: Negative for congestion, ear pain and sore throat.   Respiratory: Negative for cough and shortness of breath.   Cardiovascular: Negative for chest pain.  Gastrointestinal: Negative for abdominal pain, constipation, diarrhea, nausea and vomiting.  Genitourinary: Negative for dysuria and urgency.  Musculoskeletal: Negative for  arthralgias and myalgias.  Skin: Negative for rash.  Neurological: Negative for dizziness and headaches.  Psychiatric/Behavioral: Negative for dysphoric mood. The patient is not nervous/anxious.      Objective:  BP 110/76   Pulse 88   Temp 97.7 F (36.5 C)   Resp 16   Ht 5' (1.524 m)   Wt 211 lb 3.2 oz (95.8 kg)   BMI 41.25 kg/m   BP/Weight 11/27/2019 10/23/2019 10/08/2019  Systolic BP 110 148 116  Diastolic BP 76 94 72  Wt. (Lbs) 211.2 208.3 204  BMI 41.25 40.68 38.55    Physical Exam Vitals reviewed.  Constitutional:      Appearance: Normal appearance. She is normal weight.  Neurological:     Mental Status: She is alert and oriented to person, place, and time.  Psychiatric:        Mood and Affect: Mood normal.        Behavior: Behavior normal.     Lab Results  Component Value Date   WBC 10.0 06/12/2019   HGB 13.4 06/12/2019   HCT 41.2 06/12/2019   PLT 403 06/12/2019   GLUCOSE 100 (H) 06/12/2019   CHOL 145 06/12/2019   TRIG 121 06/12/2019   HDL 36 (L) 06/12/2019   LDLCALC 87 06/12/2019   ALT 19 06/12/2019   AST 18 06/12/2019   NA 139 06/12/2019   K 4.8 06/12/2019   CL 105 06/12/2019   CREATININE 0.94 06/12/2019   BUN 11 06/12/2019   CO2 20 06/12/2019   TSH 1.850 06/12/2019      Assessment & Plan:  1. Perirectal abscess Resolved with surgery  2. Perirectal fistula Resolved with surgery  Disability papers adjusted per request by company. Return to work restrictions were given by Careers adviser.      Follow-up: Return if symptoms worsen or fail to improve.  An After Visit Summary was printed and given to the patient.  Blane Ohara [facility] [phone]

## 2019-12-01 ENCOUNTER — Encounter: Payer: Self-pay | Admitting: Family Medicine

## 2020-02-10 ENCOUNTER — Encounter: Payer: Self-pay | Admitting: Family Medicine

## 2020-04-23 ENCOUNTER — Other Ambulatory Visit: Payer: Self-pay | Admitting: Family Medicine

## 2020-04-23 DIAGNOSIS — F410 Panic disorder [episodic paroxysmal anxiety] without agoraphobia: Secondary | ICD-10-CM

## 2020-10-21 ENCOUNTER — Encounter (HOSPITAL_COMMUNITY): Payer: Self-pay

## 2020-10-21 ENCOUNTER — Other Ambulatory Visit: Payer: Self-pay

## 2020-10-21 ENCOUNTER — Ambulatory Visit (HOSPITAL_COMMUNITY)
Admission: EM | Admit: 2020-10-21 | Discharge: 2020-10-21 | Disposition: A | Discharge status: Home / Self Care | Payer: BC Managed Care – PPO | Attending: Physician Assistant | Admitting: Physician Assistant

## 2020-10-21 DIAGNOSIS — J069 Acute upper respiratory infection, unspecified: Secondary | ICD-10-CM | POA: Diagnosis not present

## 2020-10-21 DIAGNOSIS — Z20822 Contact with and (suspected) exposure to covid-19: Secondary | ICD-10-CM | POA: Diagnosis not present

## 2020-10-21 NOTE — Discharge Instructions (Addendum)
Covid test pending. Will call if positive. Recommend symptomatic treatment, increased fluids, rest. Encouraged follow up if symptoms fail to improve or worsen in any way.

## 2020-10-21 NOTE — ED Triage Notes (Signed)
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

## 2020-10-21 NOTE — ED Provider Notes (Signed)
[facility]    CSN: [id number] Arrival date & time: 10/21/20  1618      History   Chief Complaint Chief Complaint  Patient presents with   Headache   Fever   Generalized Body Aches    HPI Shirley Lewis is a 35 y.o. female.   Patient here today for evaluation of headache, body aches, cough that started earlier today. She states she had to leave work early due to symptoms. Several co-workers have recently had COVID. She has had low grade fever with TMAX 99.58F. She denies any vomiting or abd pain or diarrhea but does admit to some nausea this morning. She tried tylenol with mild relief of symptoms.    Headache Associated symptoms: congestion, cough, fever, myalgias, nausea and sore throat   Associated symptoms: no abdominal pain, no diarrhea, no ear pain and no vomiting   Fever Associated symptoms: chills, congestion, cough, headaches, myalgias, nausea and sore throat   Associated symptoms: no diarrhea, no ear pain and no vomiting    Past Medical History:  Diagnosis Date   Anal fistula    Anxiety    History of kidney stones    History of panic attacks    Wears contact lenses     Patient Active Problem List   Diagnosis Date Noted   Encounter for general adult medical examination with abnormal findings 06/12/2019   Morbid obesity (HCC) 06/12/2019   BMI 40.0-44.9, adult (HCC) 06/12/2019   Cyst of buttocks     Past Surgical History:  Procedure Laterality Date   CYST REMOVAL TRUNK Right 08/24/2017   Procedure: EXCISION SUBCUTANEOUS CYST RIGHT BUTTOCK/THIGH;  Surgeon: Berna Bue, MD;  Location: [facility];  Service: General;  Laterality: Right;   FISTULOTOMY N/A 10/23/2019   Procedure: FISTULOTOMY;  Surgeon: Romie Levee, MD;  Location: [facility];  Service: General;  Laterality: N/A;   RECTAL EXAM UNDER ANESTHESIA N/A 10/23/2019   Procedure: RECTAL EXAM UNDER ANESTHESIA;  Surgeon: Romie Levee, MD;  Location: [facility] ;   Service: General;  Laterality: N/A;   TONSILLECTOMY  age 89    OB History   No obstetric history on file.      Home Medications    Prior to Admission medications   Medication Sig Start Date End Date Taking? Authorizing Provider  LORazepam (ATIVAN) 0.5 MG tablet TAKE 1 TABLET BY MOUTH DAILY AS NEEDED FOR ANXIETY 04/26/20   Cox, Kirsten, MD    Family History Family History  Problem Relation Age of Onset   Hypertension Maternal Grandmother    Asthma Other    COPD Other    Hypertension Other    Osteoarthritis Other    Heart attack Other    Cancer Other        Lung   Mental illness Other     Social History Social History   Tobacco Use   Smoking status: Former    Years: 10.00    Types: Cigarettes    Quit date: 02/21/2008    Years since quitting: 12.6   Smokeless tobacco: Never  Vaping Use   Vaping Use: Never used  Substance Use Topics   Alcohol use: Not Currently    Comment: occasional   Drug use: Never     Allergies   Tramadol and Phenergan [promethazine hcl]   Review of Systems Review of Systems  Constitutional:  Positive for chills and fever.  HENT:  Positive for congestion and sore throat. Negative for ear pain.  Eyes:  Negative for discharge and redness.  Respiratory:  Positive for cough. Negative for shortness of breath.   Gastrointestinal:  Positive for nausea. Negative for abdominal pain, diarrhea and vomiting.  Musculoskeletal:  Positive for myalgias.  Neurological:  Positive for headaches.    Physical Exam Triage Vital Signs ED Triage Vitals  Enc Vitals Group     BP 10/21/20 1702 (!) 134/106     Pulse Rate 10/21/20 1702 (!) 115     Resp 10/21/20 1702 20     Temp 10/21/20 1702 99.4 F (37.4 C)     Temp Source 10/21/20 1702 Oral     SpO2 10/21/20 1702 97 %     Weight --      Height --      Head Circumference --      Peak Flow --      Pain Score 10/21/20 1701 5     Pain Loc --      Pain Edu? --      Excl. in GC? --    No data  found.  Updated Vital Signs BP (!) 134/106 (BP Location: Left Arm)   Pulse (!) 115   Temp 99.4 F (37.4 C) (Oral)   Resp 20   LMP 08/30/2020 (Approximate)   SpO2 97%   Visual Acuity Right Eye Distance:   Left Eye Distance:   Bilateral Distance:    Right Eye Near:   Left Eye Near:    Bilateral Near:     Physical Exam Vitals and nursing note reviewed.  Constitutional:      General: She is not in acute distress.    Appearance: She is well-developed. She is not ill-appearing.  HENT:     Head: Normocephalic and atraumatic.     Mouth/Throat:     Mouth: Mucous membranes are moist.     Pharynx: Oropharynx is clear.  Cardiovascular:     Rate and Rhythm: Normal rate and regular rhythm.     Heart sounds: Normal heart sounds.  Pulmonary:     Effort: Pulmonary effort is normal. No respiratory distress.     Breath sounds: Normal breath sounds. No wheezing, rhonchi or rales.  Neurological:     Mental Status: She is alert.  Psychiatric:        Mood and Affect: Mood normal.        Behavior: Behavior normal.     UC Treatments / Results  Labs (all labs ordered are listed, but only abnormal results are displayed) Labs Reviewed  SARS CORONAVIRUS 2 (TAT 6-24 HRS)    EKG   Radiology No results found.  Procedures Procedures (including critical care time)  Medications Ordered in UC Medications - No data to display  Initial Impression / Assessment and Plan / UC Course  I have reviewed the triage vital signs and the nursing notes.  Pertinent labs & imaging results that were available during my care of the patient were reviewed by me and considered in my medical decision making (see chart for details).    Given recent exposure, concern for probable COVID infection. Will screen for same. Recommended symptomatic treatment and rest. Encouraged follow up if no improvement of symptoms with time or sooner with any worsening, shortness of breath, etc.    Final Clinical  Impressions(s) / UC Diagnoses   Final diagnoses:  Viral upper respiratory tract infection     Discharge Instructions      Covid test pending. Will call if positive. Recommend symptomatic treatment, increased fluids,  rest. Encouraged follow up if symptoms fail to improve or worsen in any way.      ED Prescriptions   None    PDMP not reviewed this encounter.   Tomi Bamberger, PA-C 10/21/20 1734

## 2020-10-22 LAB — SARS CORONAVIRUS 2 (TAT 6-24 HRS): SARS Coronavirus 2: NEGATIVE

## 2020-11-01 ENCOUNTER — Other Ambulatory Visit: Payer: Self-pay

## 2020-11-01 ENCOUNTER — Encounter (HOSPITAL_COMMUNITY): Payer: Self-pay | Admitting: Emergency Medicine

## 2020-11-01 ENCOUNTER — Ambulatory Visit (HOSPITAL_COMMUNITY)
Admission: EM | Admit: 2020-11-01 | Discharge: 2020-11-01 | Disposition: A | Discharge status: Home / Self Care | Payer: BLUE CROSS/BLUE SHIELD | Attending: Internal Medicine | Admitting: Internal Medicine

## 2020-11-01 ENCOUNTER — Ambulatory Visit: Payer: Self-pay

## 2020-11-01 DIAGNOSIS — M549 Dorsalgia, unspecified: Secondary | ICD-10-CM | POA: Diagnosis not present

## 2020-11-01 MED ORDER — METHOCARBAMOL 500 MG PO TABS: 500.0000 mg | ORAL_TABLET | Freq: Every evening | ORAL | 0 refills | Status: DC | PRN

## 2020-11-01 NOTE — ED Triage Notes (Signed)
Pt repots since Saturday having right upper back pains that radiate up towards neck that is worse with movement. Denies falls or injuries,. Tried heat, ice, ibuprofen and rest but not any better.
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
## 2020-11-01 NOTE — Discharge Instructions (Signed)
Gentle range of motion exercises You can take 600 mg of ibuprofen every 6-8 hours as needed for pain Make sure that she eat before taking the medication. Use heating pad on a 15-minute on-15 minutes off cycle Return to urgent care if symptoms worsen.

## 2020-11-03 ENCOUNTER — Ambulatory Visit (HOSPITAL_COMMUNITY): Payer: Self-pay

## 2020-11-03 ENCOUNTER — Encounter (HOSPITAL_COMMUNITY): Payer: Self-pay

## 2020-11-03 ENCOUNTER — Ambulatory Visit (HOSPITAL_COMMUNITY)
Admission: EM | Admit: 2020-11-03 | Discharge: 2020-11-03 | Disposition: A | Discharge status: Home / Self Care | Payer: BLUE CROSS/BLUE SHIELD | Attending: Physician Assistant | Admitting: Physician Assistant

## 2020-11-03 ENCOUNTER — Other Ambulatory Visit: Payer: Self-pay

## 2020-11-03 DIAGNOSIS — M546 Pain in thoracic spine: Secondary | ICD-10-CM

## 2020-11-03 DIAGNOSIS — M542 Cervicalgia: Secondary | ICD-10-CM | POA: Diagnosis not present

## 2020-11-03 DIAGNOSIS — T148XXA Other injury of unspecified body region, initial encounter: Secondary | ICD-10-CM | POA: Diagnosis not present

## 2020-11-03 MED ORDER — METHOCARBAMOL 500 MG PO TABS: 500.0000 mg | ORAL_TABLET | Freq: Three times a day (TID) | ORAL | 0 refills | Status: AC | PRN

## 2020-11-03 NOTE — ED Provider Notes (Signed)
[facility]    CSN: [id number] Arrival date & time: 11/03/20  0813      History   Chief Complaint Chief Complaint  Patient presents with   Appointment    0900   Back Pain    HPI Shirley Lewis is a 35 y.o. female.   Patient presents today with a weeklong history of cervical and thoracic pain.  Reports that approximately 1 week ago she was lifting heavy items including a television and did not immediately have pain but several days later developed pain that has been persistent since that time.  She was seen by our clinic on 10/22/2020 at which point she was prescribed Robaxin but has not yet started this medication.  She has been treating pain with ibuprofen and heat which has provided temporary relief of symptoms.  Pain is currently rated 6 on a 0-10 pain scale, localized to right cervical and thoracic regions, described as intense aching with periodic shooting pains that is debilitating, worse with certain movements or activities, no alleviating factors identified.  She denies any previous neck/back injury or surgery.  She denies any history of malignancy.  She is confident that she is not pregnant.  Denies any weakness, numbness, paresthesias.  She is having difficulty with daily activities as result of symptoms and is requesting extended work excuse note.  She denies any urinary symptoms including fever, nausea, vomiting, frequency, urgency, hematuria, flank pain.   Past Medical History:  Diagnosis Date   Anal fistula    Anxiety    History of kidney stones    History of panic attacks    Wears contact lenses     Patient Active Problem List   Diagnosis Date Noted   Encounter for general adult medical examination with abnormal findings 06/12/2019   Morbid obesity (HCC) 06/12/2019   BMI 40.0-44.9, adult (HCC) 06/12/2019   Cyst of buttocks     Past Surgical History:  Procedure Laterality Date   CYST REMOVAL TRUNK Right 08/24/2017   Procedure: EXCISION SUBCUTANEOUS  CYST RIGHT BUTTOCK/THIGH;  Surgeon: Berna Bue, MD;  Location: [facility];  Service: General;  Laterality: Right;   FISTULOTOMY N/A 10/23/2019   Procedure: FISTULOTOMY;  Surgeon: Romie Levee, MD;  Location: [facility];  Service: General;  Laterality: N/A;   RECTAL EXAM UNDER ANESTHESIA N/A 10/23/2019   Procedure: RECTAL EXAM UNDER ANESTHESIA;  Surgeon: Romie Levee, MD;  Location: [facility];  Service: General;  Laterality: N/A;   TONSILLECTOMY  age 37    OB History   No obstetric history on file.      Home Medications    Prior to Admission medications   Medication Sig Start Date End Date Taking? Authorizing Provider  LORazepam (ATIVAN) 0.5 MG tablet TAKE 1 TABLET BY MOUTH DAILY AS NEEDED FOR ANXIETY 04/26/20   Cox, Kirsten, MD  methocarbamol (ROBAXIN) 500 MG tablet Take 1 tablet (500 mg total) by mouth every 8 (eight) hours as needed for muscle spasms. 11/03/20   Lenya Sterne, Noberto Retort, PA-C    Family History Family History  Problem Relation Age of Onset   Hypertension Maternal Grandmother    Asthma Other    COPD Other    Hypertension Other    Osteoarthritis Other    Heart attack Other    Cancer Other        Lung   Mental illness Other     Social History Social History   Tobacco Use   Smoking status: Former  Years: 10.00    Types: Cigarettes    Quit date: 02/21/2008    Years since quitting: 12.7   Smokeless tobacco: Never  Vaping Use   Vaping Use: Never used  Substance Use Topics   Alcohol use: Not Currently    Comment: occasional   Drug use: Never     Allergies   Tramadol and Phenergan [promethazine hcl]   Review of Systems Review of Systems  Constitutional:  Positive for activity change. Negative for appetite change, fatigue and fever.  Respiratory:  Negative for cough and shortness of breath.   Cardiovascular:  Negative for chest pain.  Gastrointestinal:  Negative for abdominal pain, diarrhea, nausea and vomiting.   Genitourinary:  Negative for dysuria, flank pain, hematuria and urgency.  Musculoskeletal:  Positive for back pain and neck pain. Negative for arthralgias and myalgias.  Neurological:  Negative for dizziness, weakness, light-headedness, numbness and headaches.    Physical Exam Triage Vital Signs ED Triage Vitals  Enc Vitals Group     BP 11/03/20 0843 110/83     Pulse Rate 11/03/20 0843 (!) 101     Resp 11/03/20 0843 20     Temp 11/03/20 0843 98 F (36.7 C)     Temp Source 11/03/20 0843 Oral     SpO2 11/03/20 0843 100 %     Weight --      Height --      Head Circumference --      Peak Flow --      Pain Score 11/03/20 0842 8     Pain Loc --      Pain Edu? --      Excl. in GC? --    No data found.  Updated Vital Signs BP 110/83 (BP Location: Left Arm)   Pulse (!) 101   Temp 98 F (36.7 C) (Oral)   Resp 20   SpO2 100%   Visual Acuity Right Eye Distance:   Left Eye Distance:   Bilateral Distance:    Right Eye Near:   Left Eye Near:    Bilateral Near:     Physical Exam Vitals reviewed.  Constitutional:      General: She is awake. She is not in acute distress.    Appearance: Normal appearance. She is normal weight. She is not ill-appearing.     Comments: Very pleasant female appears stated age in no acute distress sitting comfortably in exam room  HENT:     Head: Normocephalic and atraumatic.  Cardiovascular:     Rate and Rhythm: Normal rate and regular rhythm.     Heart sounds: Normal heart sounds, S1 normal and S2 normal. No murmur heard. Pulmonary:     Effort: Pulmonary effort is normal.     Breath sounds: Normal breath sounds. No wheezing, rhonchi or rales.     Comments: Clear to auscultation bilaterally Abdominal:     General: Bowel sounds are normal.     Palpations: Abdomen is soft.     Tenderness: There is no abdominal tenderness. There is no right CVA tenderness, left CVA tenderness, guarding or rebound.     Comments: Benign abdominal exam; nontender  to palpation.  No CVA tenderness.  Musculoskeletal:     Cervical back: Tenderness present. No spasms or bony tenderness. No pain with movement.     Thoracic back: Spasms and tenderness present. No bony tenderness.     Lumbar back: No tenderness or bony tenderness.     Comments: Back: No pain percussion of vertebrae.  Tenderness palpation over right cervical and thoracic paraspinal muscles.  Spasm noted in thoracic region.  No deformity or step-off noted.  Strength 5/5 bilateral upper extremities.  Normal pincer and grip strength.  Hands neurovascularly intact.  Psychiatric:        Behavior: Behavior is cooperative.     UC Treatments / Results  Labs (all labs ordered are listed, but only abnormal results are displayed) Labs Reviewed - No data to display  EKG   Radiology No results found.  Procedures Procedures (including critical care time)  Medications Ordered in UC Medications - No data to display  Initial Impression / Assessment and Plan / UC Course  I have reviewed the triage vital signs and the nursing notes.  Pertinent labs & imaging results that were available during my care of the patient were reviewed by me and considered in my medical decision making (see chart for details).      No alarm symptoms that warrant emergent evaluation.  Encourage patient to begin Robaxin as previously prescribed and new prescription was sent to pharmacy.  She was instructed to not drive or drink alcohol while taking this medication as drowsiness is a common side effect.  She is to continue alternating over-the-counter analgesics for pain relief.  Recommended conservative treatment measures including heat, rest, stretch.  Discussed that if symptoms or not improving we would need to consider imaging and/or physical therapy which is typically arranged through her primary care provider.  She was instructed to return in several days if symptoms or not improving or see her primary care provider in  this timeframe.  Discussed alarm symptoms that warrant emergent evaluation.  Strict return precautions given to which patient expressed understanding.  Final Clinical Impressions(s) / UC Diagnoses   Final diagnoses:  Neck pain on right side  Acute right-sided thoracic back pain  Muscle strain     Discharge Instructions      I have called in a refill of Robaxin.  You can take this up to 3 times a day as needed but will cause you to be sleepy do not drive or drink alcohol while taking it.  You can continue alternating ibuprofen and Tylenol over-the-counter for pain relief.  Continue with heat, rest, stretch.  As we discussed, if your symptoms are not improving and may be worthwhile to consider imaging and/or referral to physical therapy.  Please follow-up with our clinic or your primary care provider if symptoms or not improving with medication regimen.  If you have any worsening symptoms including increased pain, fever, weakness, numbness you need to be reevaluated immediately.     ED Prescriptions     Medication Sig Dispense Auth. Provider   methocarbamol (ROBAXIN) 500 MG tablet Take 1 tablet (500 mg total) by mouth every 8 (eight) hours as needed for muscle spasms. 21 tablet Josclyn Rosales, Noberto Retort, PA-C      PDMP not reviewed this encounter.   Jeani Hawking, PA-C 11/03/20 2035

## 2020-11-03 NOTE — ED Triage Notes (Signed)
Pt reports right sided upper back pain x 4 days. Reports she was seeing 2 days ago and feels the same. Ibuprofen and hot compress gives relief.

## 2020-11-03 NOTE — Discharge Instructions (Signed)
I have called in a refill of Robaxin.  You can take this up to 3 times a day as needed but will cause you to be sleepy do not drive or drink alcohol while taking it.  You can continue alternating ibuprofen and Tylenol over-the-counter for pain relief.  Continue with heat, rest, stretch.  As we discussed, if your symptoms are not improving and may be worthwhile to consider imaging and/or referral to physical therapy.  Please follow-up with our clinic or your primary care provider if symptoms or not improving with medication regimen.  If you have any worsening symptoms including increased pain, fever, weakness, numbness you need to be reevaluated immediately.

## 2020-11-17 ENCOUNTER — Other Ambulatory Visit: Payer: Self-pay | Admitting: Family Medicine

## 2020-11-17 DIAGNOSIS — F410 Panic disorder [episodic paroxysmal anxiety] without agoraphobia: Secondary | ICD-10-CM

## 2020-11-18 NOTE — Telephone Encounter (Signed)
Pt needs appointment piror to any further refills.

## 2020-12-31 ENCOUNTER — Other Ambulatory Visit: Payer: Self-pay | Admitting: Obstetrics and Gynecology

## 2020-12-31 ENCOUNTER — Other Ambulatory Visit: Payer: Self-pay

## 2020-12-31 ENCOUNTER — Ambulatory Visit
Admission: RE | Admit: 2020-12-31 | Discharge: 2020-12-31 | Disposition: A | Discharge status: Home / Self Care | Payer: No Typology Code available for payment source | Source: Ambulatory Visit | Attending: Obstetrics and Gynecology | Admitting: Obstetrics and Gynecology

## 2020-12-31 DIAGNOSIS — R7611 Nonspecific reaction to tuberculin skin test without active tuberculosis: Secondary | ICD-10-CM

## 2023-03-05 IMAGING — CR DG CHEST 2V
2 series · 2 of 2 positions shown · non-contrast
Comparison: None.

CLINICAL DATA: 35-year-old female with a history of positive TB
test

EXAM:
CHEST - 2 VIEW

[w chest pa]
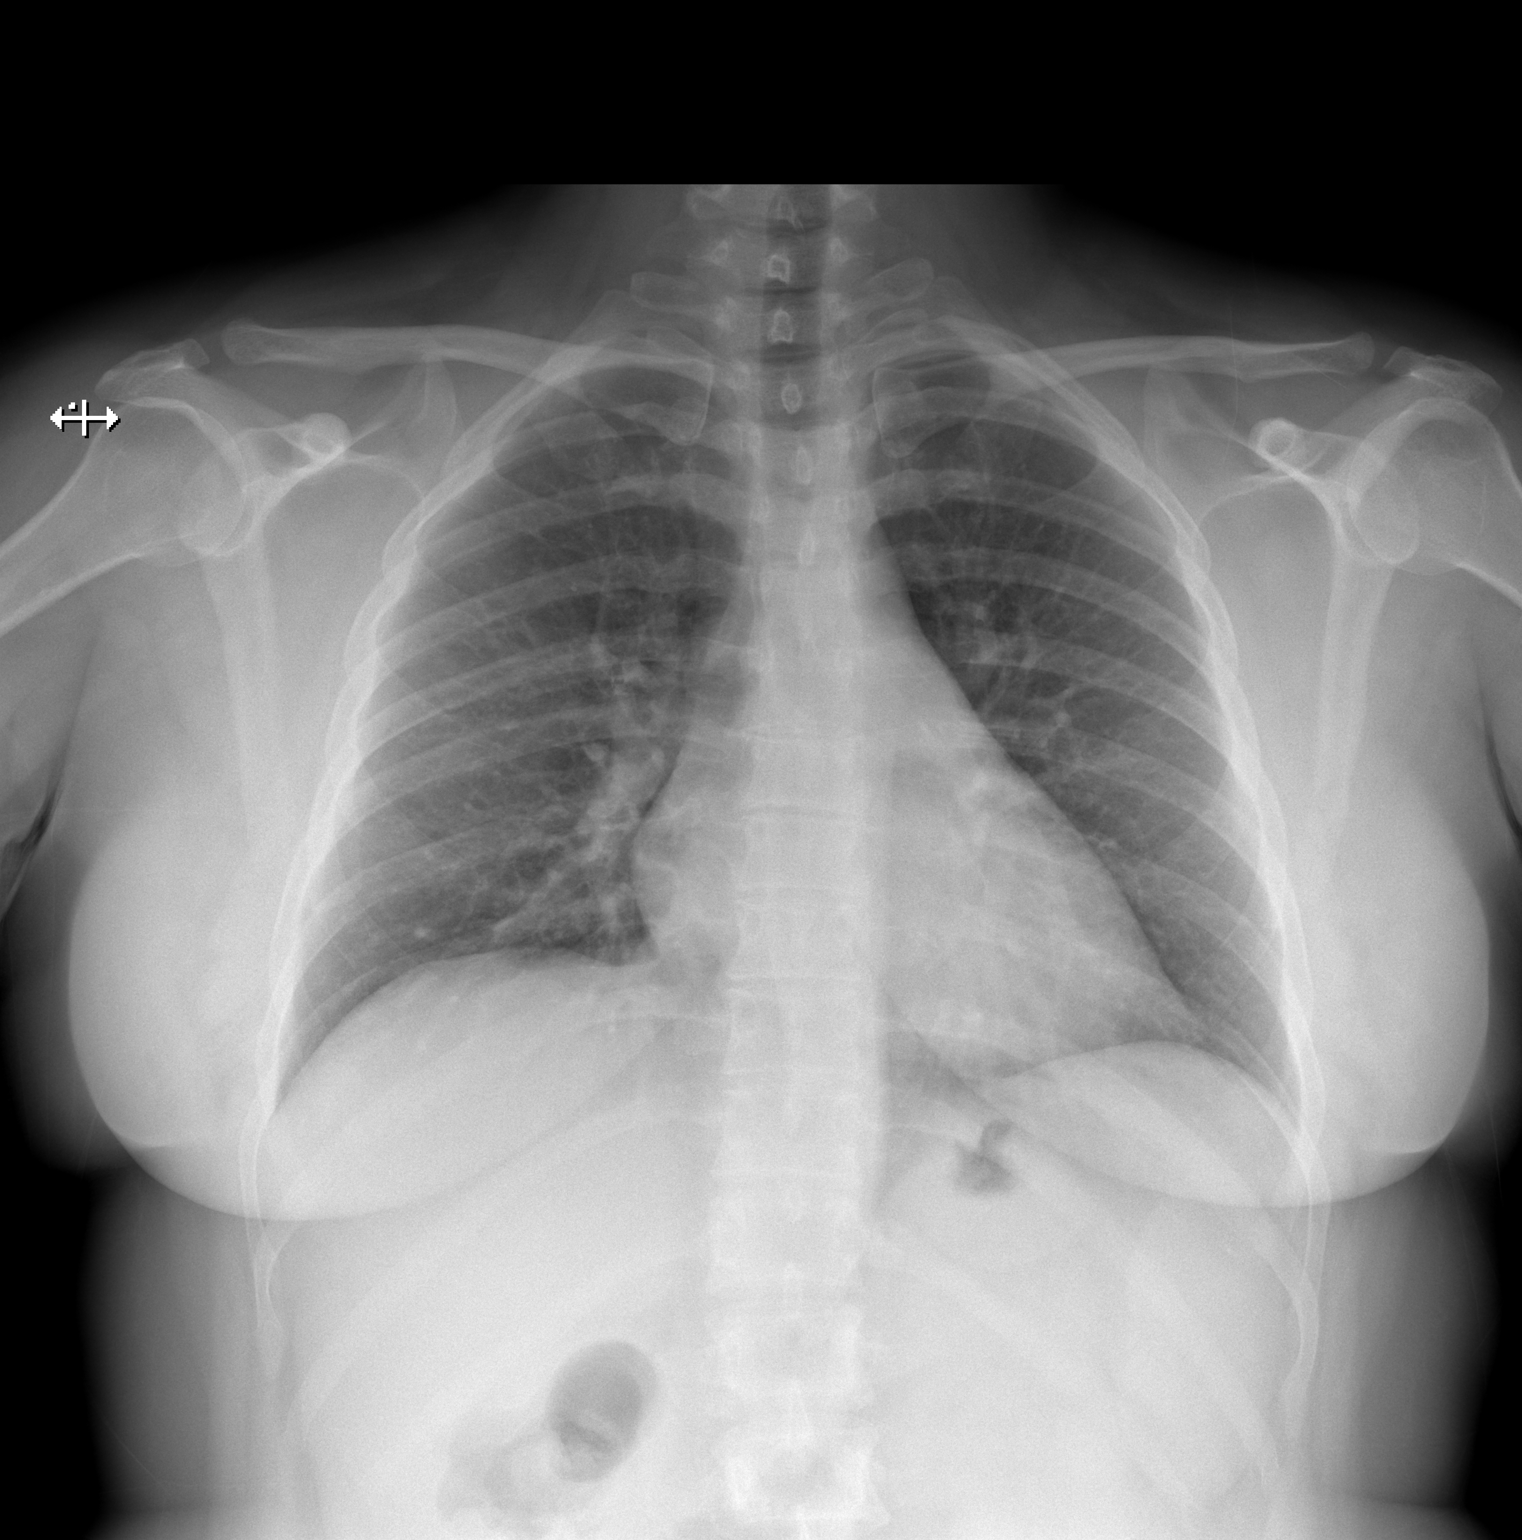

[w chest lat]
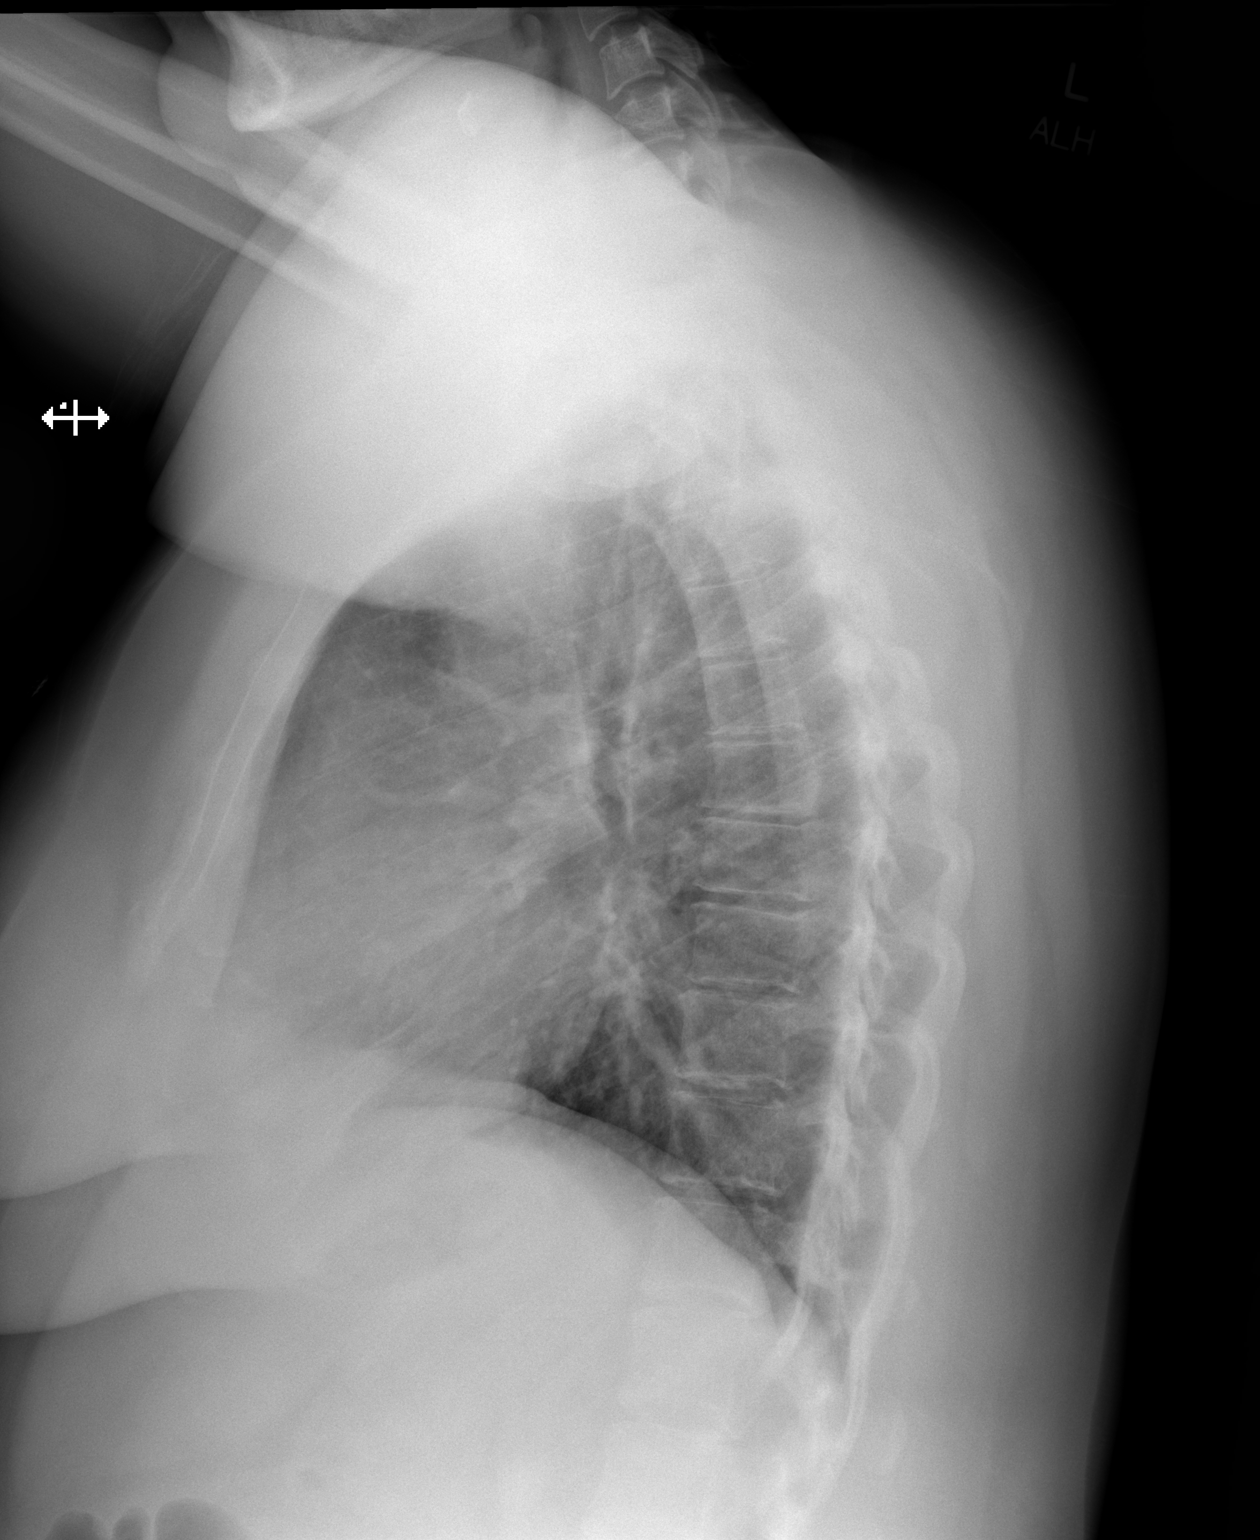

[2 of 2 positions shown; findings below may reference images not displayed]

FINDINGS: The heart size and mediastinal contours are within normal limits.
Both lungs are clear. The visualized skeletal structures are
unremarkable.
IMPRESSION: No active cardiopulmonary disease.
